# Patient Record
Sex: Female | Born: 1945 | Race: White | Hispanic: No | Marital: Married | Smoking: Never smoker
Health system: Southern US, Community
[De-identification: ages and names within clinical notes are randomized; demographics above are authoritative.]

## PROBLEM LIST (undated history)

## (undated) DIAGNOSIS — K219 Gastro-esophageal reflux disease without esophagitis: Secondary | ICD-10-CM

## (undated) DIAGNOSIS — M1990 Unspecified osteoarthritis, unspecified site: Secondary | ICD-10-CM

## (undated) DIAGNOSIS — S82899A Other fracture of unspecified lower leg, initial encounter for closed fracture: Secondary | ICD-10-CM

## (undated) DIAGNOSIS — I739 Peripheral vascular disease, unspecified: Secondary | ICD-10-CM

## (undated) HISTORY — PX: TONSILLECTOMY: SUR1361

---

## 2000-09-15 ENCOUNTER — Encounter: Admission: RE | Admit: 2000-09-15 | Discharge: 2000-09-15 | Payer: Self-pay | Admitting: Emergency Medicine

## 2006-04-07 ENCOUNTER — Ambulatory Visit (HOSPITAL_COMMUNITY): Admission: RE | Admit: 2006-04-07 | Discharge: 2006-04-07 | Payer: Self-pay | Admitting: Family Medicine

## 2008-02-09 ENCOUNTER — Emergency Department (HOSPITAL_COMMUNITY): Admission: EM | Admit: 2008-02-09 | Discharge: 2008-02-09 | Payer: Self-pay | Admitting: Emergency Medicine

## 2010-05-05 ENCOUNTER — Ambulatory Visit (HOSPITAL_COMMUNITY): Admission: RE | Admit: 2010-05-05 | Discharge: 2010-05-05 | Payer: Self-pay | Admitting: Family Medicine

## 2010-10-24 HISTORY — PX: OTHER SURGICAL HISTORY: SHX169

## 2011-10-29 DIAGNOSIS — J019 Acute sinusitis, unspecified: Secondary | ICD-10-CM | POA: Diagnosis not present

## 2011-10-29 DIAGNOSIS — Z6833 Body mass index (BMI) 33.0-33.9, adult: Secondary | ICD-10-CM | POA: Diagnosis not present

## 2011-10-29 DIAGNOSIS — J069 Acute upper respiratory infection, unspecified: Secondary | ICD-10-CM | POA: Diagnosis not present

## 2011-10-31 ENCOUNTER — Other Ambulatory Visit (HOSPITAL_COMMUNITY): Payer: Self-pay | Admitting: Family Medicine

## 2011-10-31 DIAGNOSIS — Z139 Encounter for screening, unspecified: Secondary | ICD-10-CM

## 2011-11-03 ENCOUNTER — Ambulatory Visit (HOSPITAL_COMMUNITY)
Admission: RE | Admit: 2011-11-03 | Discharge: 2011-11-03 | Disposition: A | Discharge status: Home / Self Care | Payer: Medicare Other | Source: Ambulatory Visit | Attending: Family Medicine | Admitting: Family Medicine

## 2011-11-03 DIAGNOSIS — Z13820 Encounter for screening for osteoporosis: Secondary | ICD-10-CM | POA: Insufficient documentation

## 2011-11-03 DIAGNOSIS — Z139 Encounter for screening, unspecified: Secondary | ICD-10-CM

## 2011-11-03 DIAGNOSIS — Z780 Asymptomatic menopausal state: Secondary | ICD-10-CM | POA: Diagnosis not present

## 2011-11-03 DIAGNOSIS — Z1231 Encounter for screening mammogram for malignant neoplasm of breast: Secondary | ICD-10-CM | POA: Insufficient documentation

## 2012-08-28 DIAGNOSIS — N951 Menopausal and female climacteric states: Secondary | ICD-10-CM | POA: Diagnosis not present

## 2012-08-28 DIAGNOSIS — Z30432 Encounter for removal of intrauterine contraceptive device: Secondary | ICD-10-CM | POA: Diagnosis not present

## 2012-08-28 DIAGNOSIS — Z1151 Encounter for screening for human papillomavirus (HPV): Secondary | ICD-10-CM | POA: Diagnosis not present

## 2012-08-28 DIAGNOSIS — Z124 Encounter for screening for malignant neoplasm of cervix: Secondary | ICD-10-CM | POA: Diagnosis not present

## 2012-08-28 DIAGNOSIS — Z01419 Encounter for gynecological examination (general) (routine) without abnormal findings: Secondary | ICD-10-CM | POA: Diagnosis not present

## 2012-09-10 DIAGNOSIS — Z30432 Encounter for removal of intrauterine contraceptive device: Secondary | ICD-10-CM | POA: Diagnosis not present

## 2012-09-10 DIAGNOSIS — N882 Stricture and stenosis of cervix uteri: Secondary | ICD-10-CM | POA: Diagnosis not present

## 2012-10-28 ENCOUNTER — Encounter (HOSPITAL_COMMUNITY): Payer: Self-pay | Admitting: *Deleted

## 2012-10-28 ENCOUNTER — Emergency Department (HOSPITAL_COMMUNITY)
Admission: EM | Admit: 2012-10-28 | Discharge: 2012-10-28 | Disposition: A | Discharge status: Home / Self Care | Payer: Medicare Other | Attending: Emergency Medicine | Admitting: Emergency Medicine

## 2012-10-28 ENCOUNTER — Emergency Department (HOSPITAL_COMMUNITY): Payer: Medicare Other

## 2012-10-28 DIAGNOSIS — R51 Headache: Secondary | ICD-10-CM | POA: Diagnosis not present

## 2012-10-28 DIAGNOSIS — M542 Cervicalgia: Secondary | ICD-10-CM | POA: Diagnosis not present

## 2012-10-28 DIAGNOSIS — T148XXA Other injury of unspecified body region, initial encounter: Secondary | ICD-10-CM

## 2012-10-28 DIAGNOSIS — S0990XA Unspecified injury of head, initial encounter: Secondary | ICD-10-CM | POA: Diagnosis not present

## 2012-10-28 DIAGNOSIS — S8390XA Sprain of unspecified site of unspecified knee, initial encounter: Secondary | ICD-10-CM

## 2012-10-28 DIAGNOSIS — S82899A Other fracture of unspecified lower leg, initial encounter for closed fracture: Secondary | ICD-10-CM | POA: Diagnosis not present

## 2012-10-28 DIAGNOSIS — IMO0002 Reserved for concepts with insufficient information to code with codable children: Secondary | ICD-10-CM | POA: Diagnosis not present

## 2012-10-28 DIAGNOSIS — Y9389 Activity, other specified: Secondary | ICD-10-CM | POA: Insufficient documentation

## 2012-10-28 DIAGNOSIS — M25469 Effusion, unspecified knee: Secondary | ICD-10-CM | POA: Diagnosis not present

## 2012-10-28 DIAGNOSIS — S8263XA Displaced fracture of lateral malleolus of unspecified fibula, initial encounter for closed fracture: Secondary | ICD-10-CM | POA: Diagnosis not present

## 2012-10-28 DIAGNOSIS — S51009A Unspecified open wound of unspecified elbow, initial encounter: Secondary | ICD-10-CM | POA: Diagnosis not present

## 2012-10-28 DIAGNOSIS — S5000XA Contusion of unspecified elbow, initial encounter: Secondary | ICD-10-CM | POA: Diagnosis not present

## 2012-10-28 DIAGNOSIS — S82409A Unspecified fracture of shaft of unspecified fibula, initial encounter for closed fracture: Secondary | ICD-10-CM

## 2012-10-28 DIAGNOSIS — Y929 Unspecified place or not applicable: Secondary | ICD-10-CM | POA: Insufficient documentation

## 2012-10-28 DIAGNOSIS — W1809XA Striking against other object with subsequent fall, initial encounter: Secondary | ICD-10-CM | POA: Insufficient documentation

## 2012-10-28 MED ORDER — HYDROCODONE-ACETAMINOPHEN 5-325 MG PO TABS
ORAL_TABLET | ORAL | Status: AC
  Filled 2012-10-28: qty 4

## 2012-10-28 MED ORDER — LIDOCAINE HCL (PF) 1 % IJ SOLN
5.0000 mL | Freq: Once | INTRAMUSCULAR | Status: AC
  Administered 2012-10-28: 5 mL
  Filled 2012-10-28: qty 5

## 2012-10-28 MED ORDER — HYDROCODONE-ACETAMINOPHEN 5-325 MG PO TABS: 1.0000 | ORAL_TABLET | ORAL | Status: DC | PRN

## 2012-10-28 NOTE — ED Provider Notes (Signed)
History     CSN: [id number]  Arrival date & time 10/28/12  1845   First MD Initiated Contact with Patient 10/28/12 1904      Chief Complaint  Patient presents with  . Fall    (Consider location/radiation/quality/duration/timing/severity/associated sxs/prior treatment) HPI Comments: Patient comes to the ER for evaluation after a fall. Patient reports a fall just prior to arrival. She was walking down a flight of steps and that she was at the bottom, missed last step. She fell injuring her left knee and ankle. She also hit the back of her head but there was no loss of consciousness. Additionally, patient reports that she has a laceration over the left elbow. Pain in the knee it is moderate to severe. She has a slight headache. Pain in the elbow is mild.  Patient is a 67 y.o. female presenting with fall.  Fall Associated symptoms include headaches.    History reviewed. No pertinent past medical history.  History reviewed. No pertinent past surgical history.  No family history on file.  History  Substance Use Topics  . Smoking status: Never Smoker   . Smokeless tobacco: Not on file  . Alcohol Use: No    OB History    Grav Para Term Preterm Abortions TAB SAB Ect Mult Living                  Review of Systems  HENT: Positive for neck pain.   Musculoskeletal: Negative for back pain.  Skin: Positive for wound.  Neurological: Positive for headaches.  All other systems reviewed and are negative.    Allergies  Codeine  Home Medications  No current outpatient prescriptions on file.  BP 196/87  Pulse 89  Temp 97.4 F (36.3 C) (Oral)  Resp 20  Ht 5\' 6"  (1.676 m)  Wt 210 lb (95.255 kg)  BMI 33.89 kg/m2  SpO2 100%  Physical Exam  Constitutional: She is oriented to person, place, and time. She appears well-developed and well-nourished. No distress.  HENT:  Head: Normocephalic and atraumatic.  Right Ear: Hearing normal.  Nose: Nose normal.  Mouth/Throat:  Oropharynx is clear and moist and mucous membranes are normal.  Eyes: Conjunctivae normal and EOM are normal. Pupils are equal, round, and reactive to light.  Neck: Normal range of motion. Neck supple.  Cardiovascular: Normal rate, regular rhythm, S1 normal and S2 normal.  Exam reveals no gallop and no friction rub.   No murmur heard. Pulmonary/Chest: Effort normal and breath sounds normal. No respiratory distress. She exhibits no tenderness.  Abdominal: Soft. Normal appearance and bowel sounds are normal. There is no hepatosplenomegaly. There is no tenderness. There is no rebound, no guarding, no tenderness at McBurney's point and negative Murphy's sign. No hernia.  Musculoskeletal: Normal range of motion.       Left knee: She exhibits swelling and effusion. She exhibits no deformity. tenderness found.       Arms: Neurological: She is alert and oriented to person, place, and time. She has normal strength. No cranial nerve deficit or sensory deficit. Coordination normal. GCS eye subscore is 4. GCS verbal subscore is 5. GCS motor subscore is 6.  Skin: Skin is warm and dry. Laceration noted. No rash noted. No cyanosis.  Psychiatric: She has a normal mood and affect. Her speech is normal and behavior is normal. Thought content normal.    ED Course  Procedures (including critical care time)  LACERATION REPAIR Performed by: Gilda Crease. Authorized by: Gilda Crease  Consent: Verbal consent obtained. Risks and benefits: risks, benefits and alternatives were discussed Consent given by: patient Patient identity confirmed: provided demographic data Prepped and Draped in normal sterile fashion Wound explored  Laceration Location: left elbow    Laceration Length: 2cm, jagged and contused  No Foreign Bodies seen or palpated  Anesthesia: local infiltration  Local anesthetic: lidocaine 1% without epinephrine  Anesthetic total: 4 ml  Irrigation method: syringe Amount of  cleaning: standard  Skin closure: suture  Number of sutures: 3  Technique: simple interrupted  Patient tolerance: Patient tolerated the procedure well with no immediate complications.  Labs Reviewed - No data to display Dg Ankle Complete Left  10/28/2012  *RADIOLOGY REPORT*  Clinical Data: Ankle pain post fall  LEFT ANKLE COMPLETE - 3+ VIEW  Comparison: None.  Findings: There is a spiral fracture of the distal fibula with soft tissue swelling.  Slight overriding fracture fragments.  Mild vascular calcification.  Ankle mortise intact.  Achilles and plantar spurs.  IMPRESSION: Spiral fracture distal fibula.  Soft tissue swelling.   Original Report Authenticated By: Davonna Belling, M.D.    Ct Head Wo Contrast  10/28/2012  *RADIOLOGY REPORT*  Clinical Data:  Larey Seat and hit back of head on bricks this morning. Headache.  CT HEAD WITHOUT CONTRAST  Technique:  Contiguous axial images were obtained from the base of the skull through the vertex without contrast  Comparison:  None.  Findings:  The brain has a normal appearance without evidence for hemorrhage, acute infarction, hydrocephalus, or mass lesion.  There is no extra axial fluid collection.  The skull and paranasal sinuses are normal.  IMPRESSION: Normal CT of the head without contrast.   Original Report Authenticated By: Davonna Belling, M.D.    Dg Knee Complete 4 Views Left  10/28/2012  *RADIOLOGY REPORT*  Clinical Data: Left knee and ankle pain.  Fall.  LEFT KNEE - COMPLETE 4+ VIEW  Comparison: None.  Findings: Moderate osteoarthritis noted with loss of articular space and marginal spurring.  Small to moderate knee effusion is suspected.  No definite fracture or acute bony findings.  IMPRESSION: 1.  Moderate osteoarthritis.  2.  Small to moderate knee effusion.   Original Report Authenticated By: Gaylyn Rong, M.D.      1. Contusion   2. Fibula fracture   3. Knee sprain       MDM  Patient comes to the ER for evaluation after a fall. Patient  is complaining of pain in the left ankle and knee. She also hit her head. CAT scan was performed and was negative. Patient denies any neck or back pain. There is no tenderness. Neck and back were clinically cleared. X-ray of the patient's left knee showed effusion. Examination did show palpable effusion and there is concern for possible ligamentous injury. Additionally x-ray of the ankle shows fibular fracture. This will require orthopedic evaluation and possible intervention. This would like to see Dr. Romeo Apple.  Patient has a wheelchair and crutches at home. She says she manages well on crutches. She will be nonweightbearing status. Patient placed in a sugar tong stirrup splint and is to call orthopedics in the morning.        Gilda Crease, MD 10/28/12 2152

## 2012-10-28 NOTE — ED Notes (Signed)
Fell today. PTA.  States hit head, but denies loc.  C/o pain to left knee, left elbow, and left ankle.

## 2012-10-28 NOTE — ED Notes (Signed)
Pt states that she has crutches and wheelchair at home

## 2012-10-28 NOTE — ED Notes (Signed)
Pt missed last step and fell, has a knot to posterior left of head, denies LOC; c/o left ankle and left knee pain and unable to put weight on left leg; lac to left elbow as well, last tetanus shot within last 5 years per pt

## 2012-10-30 ENCOUNTER — Ambulatory Visit (INDEPENDENT_AMBULATORY_CARE_PROVIDER_SITE_OTHER): Payer: Medicare Other | Admitting: Orthopedic Surgery

## 2012-10-30 ENCOUNTER — Encounter: Payer: Self-pay | Admitting: Orthopedic Surgery

## 2012-10-30 VITALS — BP 128/72 | Ht 66.0 in | Wt 210.0 lb

## 2012-10-30 DIAGNOSIS — S8263XA Displaced fracture of lateral malleolus of unspecified fibula, initial encounter for closed fracture: Secondary | ICD-10-CM | POA: Diagnosis not present

## 2012-10-30 NOTE — Patient Instructions (Addendum)
No weight beraing on left ankle     3 weeks cast change on Thursday PM

## 2012-10-31 ENCOUNTER — Encounter: Payer: Self-pay | Admitting: Orthopedic Surgery

## 2012-10-31 DIAGNOSIS — S8263XA Displaced fracture of lateral malleolus of unspecified fibula, initial encounter for closed fracture: Secondary | ICD-10-CM | POA: Insufficient documentation

## 2012-10-31 NOTE — Progress Notes (Signed)
  Subjective:    Patient ID: Theresa Pierce, female    DOB: [birth year], 67 y.o.   MRN: [id number]  Ankle Pain  Incident onset: January 5. The incident occurred at home. The injury mechanism was a fall. The pain is present in the left ankle. The quality of the pain is described as aching (Dull throbbing). The pain is at a severity of 5/10. The pain is moderate. The pain has been constant since onset. Associated symptoms include an inability to bear weight, a loss of motion, numbness and tingling. Pertinent negatives include no muscle weakness.      Review of Systems  Skin:       Easy bruising  Neurological: Positive for tingling and numbness.  All other systems reviewed and are negative.       Objective:   Physical Exam  Constitutional: She is oriented to person, place, and time. She appears well-developed and well-nourished.  HENT:  Head: Normocephalic.  Neck: Normal range of motion.  Cardiovascular: Intact distal pulses.   Musculoskeletal:       Right ankle: Normal.       Left ankle: She exhibits decreased range of motion, swelling and ecchymosis. She exhibits no deformity, no laceration and normal pulse. tenderness. Lateral malleolus, AITFL and CF ligament tenderness found. No medial malleolus, no posterior TFL, no head of 5th metatarsal and no proximal fibula tenderness found. Achilles tendon exhibits no pain, no defect and normal Thompson's test results.       Upper extremities Upper extremity exam  The right and left upper extremity:  Inspection and palpation revealed no abnormalities in the upper extremities.  Range of motion is full without contracture. Motor exam is normal with grade 5 strength. The joints are fully reduced without subluxation. There is no atrophy or tremor and muscle tone is normal.  All joints are stable.      Neurological: She is alert and oriented to person, place, and time. She has normal reflexes. She displays normal reflexes. She exhibits  normal muscle tone. Coordination normal.  Skin: Skin is warm and dry. No rash noted. No erythema. No pallor.  Psychiatric: She has a normal mood and affect. Her behavior is normal. Judgment and thought content normal.     Imaging shows that she has a labral illness fracture nondisplaced at the Weber B. type fracture if not a Weber C. type fracture. But the mortise is intact       Assessment & Plan:  The patient is placed in a short leg nonweightbearing cast. We could not get her foot to complete dorsiflexion will have to change the cast in 3 weeks patient made aware  Followup in 3 weeks for cast change. She will also need to have her sutures taken out in about a week. The nurse will take care of that.

## 2012-11-07 ENCOUNTER — Ambulatory Visit (INDEPENDENT_AMBULATORY_CARE_PROVIDER_SITE_OTHER): Payer: Medicare Other | Admitting: Orthopedic Surgery

## 2012-11-07 DIAGNOSIS — T148XXA Other injury of unspecified body region, initial encounter: Secondary | ICD-10-CM

## 2012-11-07 DIAGNOSIS — IMO0002 Reserved for concepts with insufficient information to code with codable children: Secondary | ICD-10-CM

## 2012-11-07 NOTE — Progress Notes (Signed)
Patient ID: Theresa Pierce, female   DOB: [birth year], 66 y.o.   MRN: [id number] Nurse reported wound clean   steristrips applied

## 2012-11-22 ENCOUNTER — Encounter: Payer: Self-pay | Admitting: Orthopedic Surgery

## 2012-11-22 ENCOUNTER — Ambulatory Visit (INDEPENDENT_AMBULATORY_CARE_PROVIDER_SITE_OTHER): Payer: Medicare Other | Admitting: Orthopedic Surgery

## 2012-11-22 VITALS — BP 138/76 | Ht 66.0 in | Wt 210.0 lb

## 2012-11-22 DIAGNOSIS — S8263XA Displaced fracture of lateral malleolus of unspecified fibula, initial encounter for closed fracture: Secondary | ICD-10-CM | POA: Diagnosis not present

## 2012-11-22 NOTE — Progress Notes (Signed)
Patient ID: Theresa Pierce, female   DOB: [birth year], 67 y.o.   MRN: [id number] BP 138/76  Ht 5\' 6"  (1.676 m)  Wt 210 lb (95.255 kg)  BMI 33.89 kg/m2  Chief Complaint  Patient presents with  . Follow-up    cast change and fractore care DOI 10/28/12    1. Lateral malleolar fracture    Cast change today to bring the foot into more neutral position.  X-ray 4 weeks in cast continued tiptoe weightbearing status

## 2012-11-22 NOTE — Patient Instructions (Addendum)
Keep cast dry 

## 2012-12-20 ENCOUNTER — Ambulatory Visit (INDEPENDENT_AMBULATORY_CARE_PROVIDER_SITE_OTHER): Payer: Medicare Other

## 2012-12-20 ENCOUNTER — Ambulatory Visit (INDEPENDENT_AMBULATORY_CARE_PROVIDER_SITE_OTHER): Payer: Medicare Other | Admitting: Orthopedic Surgery

## 2012-12-20 VITALS — BP 138/84 | Ht 66.0 in | Wt 210.0 lb

## 2012-12-20 DIAGNOSIS — S8262XD Displaced fracture of lateral malleolus of left fibula, subsequent encounter for closed fracture with routine healing: Secondary | ICD-10-CM

## 2012-12-20 DIAGNOSIS — S82892D Other fracture of left lower leg, subsequent encounter for closed fracture with routine healing: Secondary | ICD-10-CM

## 2012-12-20 DIAGNOSIS — S8290XD Unspecified fracture of unspecified lower leg, subsequent encounter for closed fracture with routine healing: Secondary | ICD-10-CM | POA: Diagnosis not present

## 2012-12-20 NOTE — Progress Notes (Signed)
Patient ID: Theresa Pierce, female   DOB: [birth year], 67 y.o.   MRN: [id number] Chief Complaint  Patient presents with  . Follow-up    4 week recheck on left ankle fracture with xray in cast./ JAN 5TH doi     BP 138/84  Ht 5\' 6"  (1.676 m)  Wt 210 lb (95.255 kg)  BMI 33.91 kg/m2  Ankle fracture, left, closed, with routine healing, subsequent encounter - Plan: DG Ankle Complete Left  Lateral malleolar fracture, left, closed, with routine healing, subsequent encounter   Routine followup x-ray in cast fracture looks like it's healing. Mortise is intact.  Recommend weightbearing as tolerated in a Cam Walker followup one month for an x-ray

## 2012-12-20 NOTE — Patient Instructions (Signed)
APPLY WEIGHT AS TOLERATED   WEAR BOOT TO WALK

## 2013-01-29 ENCOUNTER — Encounter: Payer: Self-pay | Admitting: Orthopedic Surgery

## 2013-01-29 ENCOUNTER — Ambulatory Visit (INDEPENDENT_AMBULATORY_CARE_PROVIDER_SITE_OTHER): Payer: Medicare Other | Admitting: Orthopedic Surgery

## 2013-01-29 ENCOUNTER — Ambulatory Visit (INDEPENDENT_AMBULATORY_CARE_PROVIDER_SITE_OTHER): Payer: Medicare Other

## 2013-01-29 VITALS — BP 130/90 | Ht 66.0 in | Wt 210.0 lb

## 2013-01-29 DIAGNOSIS — S8290XD Unspecified fracture of unspecified lower leg, subsequent encounter for closed fracture with routine healing: Secondary | ICD-10-CM | POA: Diagnosis not present

## 2013-01-29 DIAGNOSIS — S82892D Other fracture of left lower leg, subsequent encounter for closed fracture with routine healing: Secondary | ICD-10-CM

## 2013-01-29 DIAGNOSIS — S82899A Other fracture of unspecified lower leg, initial encounter for closed fracture: Secondary | ICD-10-CM | POA: Insufficient documentation

## 2013-01-29 NOTE — Patient Instructions (Addendum)
Air cast   2 months then x-rays   Stocking left foot

## 2013-01-29 NOTE — Progress Notes (Signed)
Patient ID: Theresa Pierce, female   DOB: 1946/08/12, 67 y.o.   MRN: [id number] Chief Complaint  Patient presents with  . Follow-up    5 week recheck with xray left ankle.    BP 130/90  Ht 5\' 6"  (1.676 m)  Wt 210 lb (95.255 kg)  BMI 33.91 kg/m2  Ankle fracture, left, closed, with routine healing, subsequent encounter - Plan: DG Ankle Complete Left   Repeat x-ray this is week #12 status post left ankle fracture treated with Cam Walker  X-ray show the fracture is healing alignment is good on the mortise view  She still tender over the fracture site she has some foot and ankle edema  Recommend Aircast weight-bear as tolerated compression stocking history of venous stripping bilaterally  Complains of numbness resolving left foot and ankle  X-ray next visit

## 2013-02-01 ENCOUNTER — Telehealth: Payer: Self-pay | Admitting: Orthopedic Surgery

## 2013-02-01 NOTE — Telephone Encounter (Signed)
USE otc

## 2013-02-01 NOTE — Telephone Encounter (Signed)
[facility] called about the prescription you gave [name] for 20-30 knee high hose.  April said Theresa Pierce does not want to wear anything that is tight.   She can get size 8-15 over the counter, unless you want to write for a lower number than 20-30. April can be reached at [phone].

## 2013-02-01 NOTE — Telephone Encounter (Signed)
Called back to [place]; relayed to April.  Patient is getting the over the counter.

## 2013-02-05 DIAGNOSIS — Z681 Body mass index (BMI) 19 or less, adult: Secondary | ICD-10-CM | POA: Diagnosis not present

## 2013-02-05 DIAGNOSIS — Z7182 Exercise counseling: Secondary | ICD-10-CM | POA: Diagnosis not present

## 2013-02-05 DIAGNOSIS — Z713 Dietary counseling and surveillance: Secondary | ICD-10-CM | POA: Diagnosis not present

## 2013-02-05 DIAGNOSIS — R609 Edema, unspecified: Secondary | ICD-10-CM | POA: Diagnosis not present

## 2013-04-02 ENCOUNTER — Ambulatory Visit (INDEPENDENT_AMBULATORY_CARE_PROVIDER_SITE_OTHER): Payer: Medicare Other

## 2013-04-02 ENCOUNTER — Ambulatory Visit: Payer: Medicare Other

## 2013-04-02 ENCOUNTER — Ambulatory Visit (INDEPENDENT_AMBULATORY_CARE_PROVIDER_SITE_OTHER): Payer: Medicare Other | Admitting: Orthopedic Surgery

## 2013-04-02 VITALS — BP 158/90 | Ht 66.0 in | Wt 210.0 lb

## 2013-04-02 DIAGNOSIS — IMO0001 Reserved for inherently not codable concepts without codable children: Secondary | ICD-10-CM | POA: Diagnosis not present

## 2013-04-02 DIAGNOSIS — S82892D Other fracture of left lower leg, subsequent encounter for closed fracture with routine healing: Secondary | ICD-10-CM

## 2013-04-02 DIAGNOSIS — S82891D Other fracture of right lower leg, subsequent encounter for closed fracture with routine healing: Secondary | ICD-10-CM

## 2013-04-02 NOTE — Patient Instructions (Addendum)
WEAR BRACE TO WORK UNTIL June 30TH MAKE APPOINTMENT FOR LEFT KNEE EVAL

## 2013-04-02 NOTE — Progress Notes (Signed)
Patient ID: Theresa Pierce, female   DOB: 1946-03-31, 67 y.o.   MRN: [id number] Chief Complaint  Patient presents with  . Follow-up    2 month recheck left ankle fracture; DOI 10-28-12    Fracture care for left ankle lateral malleolus fracture followup x-ray  X-ray shows fracture bridging  Recommend edema stocking, Aircast until the end of the month  The patient will schedule appointment for knee x-ray and evaluation for long-term long-standing knee pain

## 2013-04-23 ENCOUNTER — Ambulatory Visit (INDEPENDENT_AMBULATORY_CARE_PROVIDER_SITE_OTHER): Payer: Medicare Other

## 2013-04-23 ENCOUNTER — Ambulatory Visit (INDEPENDENT_AMBULATORY_CARE_PROVIDER_SITE_OTHER): Payer: Medicare Other | Admitting: Orthopedic Surgery

## 2013-04-23 VITALS — BP 154/90 | Ht 66.0 in | Wt 210.0 lb

## 2013-04-23 DIAGNOSIS — M23329 Other meniscus derangements, posterior horn of medial meniscus, unspecified knee: Secondary | ICD-10-CM | POA: Diagnosis not present

## 2013-04-23 DIAGNOSIS — M25562 Pain in left knee: Secondary | ICD-10-CM

## 2013-04-23 DIAGNOSIS — M1710 Unilateral primary osteoarthritis, unspecified knee: Secondary | ICD-10-CM

## 2013-04-23 DIAGNOSIS — M25569 Pain in unspecified knee: Secondary | ICD-10-CM | POA: Diagnosis not present

## 2013-04-23 DIAGNOSIS — M23322 Other meniscus derangements, posterior horn of medial meniscus, left knee: Secondary | ICD-10-CM

## 2013-04-23 NOTE — Progress Notes (Signed)
Patient ID: Theresa Pierce, female   DOB: 11-03-1945, 67 y.o.   MRN: [id number] Chief Complaint  Patient presents with  . Knee Pain    Left knee pain d/t injury 10/28/12    History 67 year-old female status post left ankle injury presents with a history of chronic pain and giving out symptoms in her left knee worsened since her injury of her ankle in January. She takes Aleve, Tylenol, Advil as needed. Complains of swelling 1/10 pain with catching grinding and swelling and giving out symptoms of the left knee  Review of systems 14 systems are negative except for the musculoskeletal and hematologic system easy bruising  She did have a vein closing procedure bilaterally for chronic venous stasis disease  She is allergic to codeine otherwise healthy  Family history heart disease arthritis and cancer  Married no smoking or drinking history. She is followed by [facility].  Vital signs BP 154/90  Ht 5\' 6"  (1.676 m)  Wt 210 lb (95.255 kg)  BMI 33.91 kg/m2   General appearance: the patient is well-developed and well-nourished, grooming and hygiene are normal, body habitus into more from body habitus  The patient is alert and oriented x 3; mood and affect are normal  Ambulatory status no assistive devices  /Left knee Inspection medial jointline tenderness crepitance in the patellofemoral joint Range of motion 125 The Lachman test is normal the anterior and posterior drawer tests are normal and the collateral ligaments are stable Motor exam 5/5 Skin normal; no rash or laceration  McMurray's sign negative  The right  knee Inspection revealed no tenderness, ROM was normal and motor exam grade 5/5 quad strength. 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 BRace. Economy hinged a wrap Physical therapy. Return 6 weeks see if improvement has not discussed surgical options  Knee  Injection Procedure Note  Pre-operative Diagnosis: left knee oa  Post-operative Diagnosis: same  Indications: pain  Anesthesia: ethyl chloride   Procedure Details   Verbal consent was obtained for the procedure. Time out was completed.The joint was prepped with alcohol, followed by  Ethyl chloride spray and A 20 gauge needle was inserted into the knee via lateral approach; 4ml 1% lidocaine and 1 ml of depomedrol  was then injected into the joint . The needle was removed and the area cleansed and dressed.  Complications:  None; patient tolerated the procedure well.

## 2013-04-23 NOTE — Patient Instructions (Addendum)
You have received a steroid shot. 15% of patients experience increased pain at the injection site with in the next 24 hours. This is best treated with ice and tylenol extra strength 2 tabs every 8 hours. If you are still having pain please call the office.   Wear brace daily when walking  Take aleve or tylenol   Start therapy

## 2013-05-02 ENCOUNTER — Ambulatory Visit (HOSPITAL_COMMUNITY)
Admission: RE | Admit: 2013-05-02 | Discharge: 2013-05-02 | Disposition: A | Discharge status: Home / Self Care | Payer: Medicare Other | Source: Ambulatory Visit | Attending: Orthopedic Surgery | Admitting: Orthopedic Surgery

## 2013-05-02 DIAGNOSIS — M25569 Pain in unspecified knee: Secondary | ICD-10-CM | POA: Diagnosis not present

## 2013-05-02 DIAGNOSIS — IMO0001 Reserved for inherently not codable concepts without codable children: Secondary | ICD-10-CM | POA: Diagnosis not present

## 2013-05-02 NOTE — Evaluation (Signed)
Physical Therapy Evaluation  Patient Details  Name: Theresa Pierce MRN: [id number] Date of Birth: 1946-02-10  Today's Date: 05/02/2013 Time: 0805-0850 PT Time Calculation (min): 45 min Charges: 1 evaluation             Visit#: 1 of 6  Re-eval: 05/23/13 Assessment Diagnosis: Knee pain Next MD Visit: Dr. 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  her c/co: pain when it gives away on her (2x/since January), feeling like she will feel , she is very cautious when she walks, she has increased fear of falling, difficulty ascending and descending steps reciprocally due to hip pain and knee instability.  She feels that she is limping more with her knee brace, she is not sure if it is because of her knee or ankle.  She has decreased feeling to the bottom of her foot.  How long can you walk comfortably?: has to watch where she works.  Pain Assessment Currently in Pain?: Yes Pain Score: 0-No pain Pain Location: Knee Pain Orientation: Left Pain Onset: More than a month ago Pain Frequency: Intermittent Pain Relieving Factors: advil/alleve or tyleonol when needed for her ankle. Effect of Pain on Daily Activities: fear of falling  Balance Screening Balance Screen Has the patient fallen in the past 6 months: Yes How many times?: 1 (multiple stumbling) Has the patient had a decrease in activity level because of a fear of falling? : Yes Is the patient reluctant to leave their home because of a fear of falling? 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    Cognition/Observation Observation/Other Assessments Observations: comes in with knee brace  Sensation/Coordination/Flexibility/Functional Tests Functional Tests Functional Tests: Lower Extremity Functional Scale: 44/80  LLE AROM (degrees) Left Knee Extension: 3 Left Knee Flexion: 138 LLE Strength Left Hip Flexion:  (4+/5) Left Hip Extension: 5/5 Left Hip ABduction: 5/5 Left Hip ADduction: 5/5 Left Knee Flexion: 3+/5 Left Knee Extension: 3+/5 Left Ankle Dorsiflexion: 5/5  Mobility/Balance  Ambulation/Gait Ambulation/Gait: Yes Gait Pattern: Antalgic;Left flexed knee in stance Static Standing Balance Single Leg Stance - Right Leg: 8 Single Leg Stance - Left Leg: 4 Tandem Stance - Right Leg: 10 Tandem Stance - Left Leg: 10 Rhomberg - Eyes Opened: 10 Rhomberg - Eyes Closed: 10   Exercise/Treatments Supine Quad Sets: Left;5 reps Short Arc Quad Sets: Left;5 reps Straight Leg Raises: Left;5 reps Patellar Mobs: demonstration and education Prone  Hamstring Curl: 5 reps Hip Extension: Left;5 reps    Physical Therapy Assessment and Plan PT Assessment and Plan Clinical Impression Statement: Theresa Pierce is referred to PT for knee pain which has become progressivly worse since her malleloar fracture she sustained when she fell down her outdoor step with impairments listed below.  At this time she is most limited by her  knee flexion in standing.  Encouraged pt d/c use of her knee pillow when sleeping. Pt will benefit from skilled therapeutic intervention in order to improve on the following deficits: Abnormal gait;Decreased balance;Decreased range of motion;Decreased strength Rehab Potential: Good PT Frequency:  Min 2X/week PT Duration:  (3 weeks) PT Treatment/Interventions: Therapeutic activities;Therapeutic exercise;Balance training;Patient/family education;Manual techniques PT Plan: Demonstrate HEP to improve knee strength.  Add squats, heel/toe raises, SLS and mat activities. to encourage LE strengthening and flexibility.     Goals Home Exercise Program Pt/caregiver will Perform Home Exercise Program: Independently PT Goal: Perform Home Exercise Program - Progress: Goal set today PT Short Term Goals Time to Complete Short Term Goals: 3 weeks PT Short Term Goal 1: Pt will improve her knee AROM 0-135 degrees for improved gait mechanics. PT Short Term Goal 2: Pt will report pain less than 4/10 when going from sit to stand.  PT Short Term Goal 3: Pt will improve her knee strength to WNL in order to ascend and descend her basement stairs with reciprocal pattern with 1 handrail.   Problem List Patient Active Problem List   Diagnosis Date Noted  . Ankle fracture 01/29/2013  . Lateral malleolar fracture 10/31/2012    PT Plan of Care PT Home Exercise Plan: see scanned report PT Patient Instructions: d/c use of pillow under knee, answered questions about diagnosis.  Consulted and Agree with Plan of Care: Patient  GP Functional Assessment Tool Used: LEFS Functional Limitation: Mobility: Walking and moving around Mobility: Walking and Moving Around Current Status [phone]): At least 40 percent but less than 60 percent impaired, limited or restricted Mobility: Walking and Moving Around Goal Status [phone]): At least 1 percent but less than 20 percent impaired, limited or restricted  Theresa Pierce, MPT, ATC 05/02/2013, 12:14 PM  Physician Documentation Your signature is required to indicate approval of the treatment plan as stated above.  Please sign and either send electronically or make a copy of this report for your files and return this physician signed original.   Please mark one 1.__approve of  plan  2. ___approve of plan with the following conditions.   ______________________________                                                          _____________________ Physician Signature                                                                                                             Date

## 2013-05-06 ENCOUNTER — Ambulatory Visit (HOSPITAL_COMMUNITY): Payer: Medicare Other | Admitting: Physical Therapy

## 2013-05-07 ENCOUNTER — Ambulatory Visit (HOSPITAL_COMMUNITY)
Admission: RE | Admit: 2013-05-07 | Discharge: 2013-05-07 | Disposition: A | Discharge status: Home / Self Care | Payer: Medicare Other | Source: Ambulatory Visit | Attending: Orthopedic Surgery | Admitting: Orthopedic Surgery

## 2013-05-07 NOTE — Progress Notes (Signed)
Physical Therapy Treatment Patient Details  Name: Theresa Pierce MRN: [id number] Date of Birth: [birth year]  Today's Date: 05/07/2013 Time: 7829-5621 PT Time Calculation (min): 40 min Charges:  TE: [id number] Visit#: 2 of 6  Re-eval: 05/23/13 Assessment Diagnosis: Knee pain Next MD Visit: Dr. Romeo Apple - 06/11/13  Authorization: Medicare  Authorization Time Period:    Authorization Visit#: 2 of 10   Subjective: Symptoms/Limitations Symptoms: Pt reports that she tried to do the exercises this saturday, but has not had time since. States she has taken her pillow out from under her knee.  Pain Assessment Currently in Pain?: Yes Pain Score: 4  Pain Location: Ankle Pain Orientation: Left  Precautions/Restrictions     Exercise/Treatments Stretches Active Hamstring Stretch: 2 reps;30 seconds Gastroc Stretch: 3 reps;30 seconds;Limitations Gastroc Stretch Limitations: slant board Aerobic Elliptical: NuStep: 8 minutes Standing Heel Raises: 10 reps;Limitations Functional Squat: 10 reps;Limitations SLS: 3x15 sec holds w/HHA Supine Quad Sets: 10 reps;Left Short Arc Quad Sets: 15 reps;Limitations;Left Short Arc Quad Sets Limitations: small towel roll 2# Bridges: 15 reps Straight Leg Raises: Left;15 reps Sidelying Hip ABduction: Both;15 reps Prone  Hamstring Curl: 15 reps;Limitations (Left) Hamstring Curl Limitations: 2# Hip Extension: Both;15 reps    Physical Therapy Assessment and Plan PT Assessment and Plan Clinical Impression Statement: Pt demonstrated HEP today with mod cueing for proper technique.  Able to achieve 0 degrees of extension. Teach back for when she is able to complete her HEP.  PT Treatment/Interventions: Therapeutic activities;Therapeutic exercise;Balance training;Patient/family education;Manual techniques PT Plan: Check to make sure pt is adherent with HEP.  Continue to progress LE strengtheing: standing knee flexion, 2 in stair training, bridges.  Progress  to wall squats when able.     Goals Home Exercise Program Pt/caregiver will Perform Home Exercise Program: Independently PT Goal: Perform Home Exercise Program - Progress: Progressing toward goal PT Short Term Goals Time to Complete Short Term Goals: 3 weeks PT Short Term Goal 1: Pt will improve her knee AROM 0-135 degrees for improved gait mechanics. PT Short Term Goal 1 - Progress: Met PT Short Term Goal 2: Pt will report pain less than 4/10 when going from sit to stand.  PT Short Term Goal 2 - Progress: Progressing toward goal PT Short Term Goal 3: Pt will improve her knee strength to WNL in order to ascend and descend her basement stairs with reciprocal pattern with 1 handrail.  PT Short Term Goal 3 - Progress: Progressing toward goal  Problem List Patient Active Problem List   Diagnosis Date Noted  . Ankle fracture 01/29/2013  . Lateral malleolar fracture 10/31/2012    PT Plan of Care PT Patient Instructions: importance to continue with HEP.  Consulted and Agree with Plan of Care: Patient  GP Functional Assessment Tool Used: LEFS  Kaisei Gilbo 05/07/2013, 8:40 AM

## 2013-05-09 ENCOUNTER — Ambulatory Visit (HOSPITAL_COMMUNITY)
Admission: RE | Admit: 2013-05-09 | Discharge: 2013-05-09 | Disposition: A | Discharge status: Home / Self Care | Payer: Medicare Other | Source: Ambulatory Visit | Attending: Orthopedic Surgery | Admitting: Orthopedic Surgery

## 2013-05-09 NOTE — Progress Notes (Signed)
Physical Therapy Treatment Patient Details  Name: Theresa Pierce MRN: [id number] Date of Birth: [birth year]  Today's Date: 05/09/2013 Time: 4098-1191 Charges: Therex x 40' (0800-0840) Total time: 48 minutes  Visit#: 3 of 6  Re-eval: 05/23/13  Authorization: Medicare  Authorization Visit#: 3 of 10   Subjective: Symptoms/Limitations Symptoms: Pt states that she is doing her exercises when she is able to.  She reports that she is more stiff this morning.  Feels she is progressing well, except this morning when it popped.  Pain Assessment Currently in Pain?: No/denies   Exercise/Treatments Stretches Gastroc Stretch: 3 reps;30 seconds;Limitations Gastroc Stretch Limitations: slant board Aerobic NuStep: 8 minutes L2 at end of session Standing Heel Raises: 15 reps;Limitations Heel Raises Limitations: Toe raises x 10 Lateral Step Up: 10 reps;Step Height: 4";Left Forward Step Up: 10 reps;Step Height: 6";Left Functional Squat: 15 reps Rocker Board: 2 minutes SLS: LLE 10" max of 5 Supine Short Arc Quad Sets: 15 reps;Left Short Arc Quad Sets Limitations: medium bolster 3# Bridges: 15 reps;Limitations Bridges Limitations: w/hip adduction squeeze Straight Leg Raises: Left;15 reps Sidelying Hip ABduction: Left;15 reps Hip ADduction: Right;10 reps Prone  Hamstring Curl: 15 reps Hamstring Curl Limitations: 3# Hip Extension: Both;15 reps  Physical Therapy Assessment and Plan PT Assessment and Plan Clinical Impression Statement: Pt able to demonstrate HEP with min cueing and facilitation only with hip abduction. Added squats and stairs to improve LE strength and function. Continued to encourage HEP. Pt is able to complete new exercises with minimal difficulty after initial cueing and demo. Pt is without complaint of increased pain throughout session. PT Treatment/Interventions: Therapeutic activities;Therapeutic exercise;Balance training;Patient/family education;Manual techniques PT  Plan: Check to make sure pt is adherent with HEP.  Continue to progress LE strengtheing: standing knee flexion, 2 in stair training, bridges.  Progress to wall squats when able.     Goals Home Exercise Program Pt/caregiver will Perform Home Exercise Program: Independently PT Goal: Perform Home Exercise Program - Progress: Partly met PT Short Term Goals Time to Complete Short Term Goals: 3 weeks PT Short Term Goal 1: Pt will improve her knee AROM 0-135 degrees for improved gait mechanics. PT Short Term Goal 1 - Progress: Met PT Short Term Goal 2: Pt will report pain less than 4/10 when going from sit to stand.  PT Short Term Goal 2 - Progress: Progressing toward goal PT Short Term Goal 3: Pt will improve her knee strength to WNL in order to ascend and descend her basement stairs with reciprocal pattern with 1 handrail.  PT Short Term Goal 3 - Progress: Progressing toward goal  Problem List Patient Active Problem List   Diagnosis Date Noted  . Ankle fracture 01/29/2013  . Lateral malleolar fracture 10/31/2012    PT Plan of Care PT Patient Instructions: importance to continue with HEP.  Consulted and Agree with Plan of Care: Patient  Seth Bake, PTA  05/09/2013, 8:49 AM

## 2013-05-14 ENCOUNTER — Ambulatory Visit (HOSPITAL_COMMUNITY)
Admission: RE | Admit: 2013-05-14 | Discharge: 2013-05-14 | Disposition: A | Discharge status: Home / Self Care | Payer: Medicare Other | Source: Ambulatory Visit | Attending: Family Medicine | Admitting: Family Medicine

## 2013-05-14 NOTE — Progress Notes (Signed)
Physical Therapy Treatment Patient Details  Name: Theresa Pierce MRN: [id number] Date of Birth: 21-Nov-1945  Today's Date: 05/14/2013 Time: 0802-0848 PT Time Calculation (min): 46 min Charge: TE 46' 0802-0848  Visit#: 4 of 6  Re-eval: 05/23/13 Assessment Diagnosis: Knee pain Next MD Visit: Dr. Romeo Apple - 06/11/13  Authorization: Medicare  Authorization Time Period:    Authorization Visit#: 4 of 10   Subjective: Symptoms/Limitations Symptoms: Pt stated she has minimal pain in knee, more in ankle .  Pt stated decreased comfidence with her balance.   Pain Assessment Currently in Pain?: Yes Pain Score: 1  Pain Location: Ankle Pain Orientation: Left  Objective:   Exercise/Treatments Aerobic Elliptical: NuStep: 10 minutes hill #3  Standing Heel Raises: Limitations Heel Raises Limitations: Toe raises x 10; toe walking 1RT Lateral Step Up: Left;10 reps;Hand Hold: 1;Step Height: 6" Forward Step Up: Left;15 reps;Hand Hold: 1;Step Height: 6" Step Down: Left;10 reps;Hand Hold: 1;Step Height: 6" Rocker Board: 2 minutes SLS: Lt 13", R 28" max of 3 Other Standing Knee Exercises: tandem, retro and side stepping 1RT Supine  time Sidelying  time Prone    time      Physical Therapy Assessment and Plan PT Assessment and Plan Clinical Impression Statement: Progressed therex, increased heigth with stair training for began step down for eccentric quadricep strengthening with noted musculature fatigue.  Added balance activties to improve pt.'s confidence with gait mechanics, verbal cueing to improve proprioception with foot placement.  Pt encouraged to increase frequency with HEP for maximum benefits and to apply ice to ankle and knee for pain control.   PT Plan: Continue with current POC for LE strengthening.  Continue strengthenig standing knee flexion, stair training, bridges.  Progress to wall squats when able.     Goals    Problem List Patient Active Problem List   Diagnosis  Date Noted  . Ankle fracture 01/29/2013  . Lateral malleolar fracture 10/31/2012    PT - End of Session Activity Tolerance: Patient tolerated treatment well General Behavior During Therapy: [facility] for tasks assessed/performed  GP    Juel Burrow 05/14/2013, 11:45 AM

## 2013-05-16 ENCOUNTER — Ambulatory Visit (HOSPITAL_COMMUNITY)
Admission: RE | Admit: 2013-05-16 | Discharge: 2013-05-16 | Disposition: A | Discharge status: Home / Self Care | Payer: Medicare Other | Source: Ambulatory Visit | Attending: Orthopedic Surgery | Admitting: Orthopedic Surgery

## 2013-05-16 NOTE — Progress Notes (Signed)
Physical Therapy Treatment Patient Details  Name: Theresa Pierce MRN: [id number] Date of Birth: [birth year]  Today's Date: 05/16/2013 Time: 0802-0846 PT Time Calculation (min): 44 min  Visit#: 5 of 6  Re-eval: 05/23/13 Authorization: Medicare  Authorization Visit#: 5 of 10  Charges: therex 42'  Subjective: Symptoms/Limitations Symptoms: Continued aching in her Lt ankle, overall Lt knee feels good. Pain Assessment Currently in Pain?: Yes Pain Score: 1  Pain Location: Ankle Pain Orientation: Left   Exercise/Treatments Aerobic Elliptical: NuStep: 10 minutes hill #3  Standing Heel Raises: 20 reps;Limitations Heel Raises Limitations: Toe raises x 20 Lateral Step Up: Left;15 reps;Hand Hold: 1;Step Height: 6" Wall Squat: 10 reps;5 seconds Rocker Board: 2 minutes SLS: Lt 33", R 35" max of 3 Other Standing Knee Exercises: tandem, retro, heel walk, toe walk 1RT Supine Bridges: 15 reps;Limitations Bridges Limitations: w/hip adduction squeeze       Physical Therapy Assessment and Plan PT Assessment and Plan Clinical Impression Statement: Continues to do well with therex, exhibiting good form.  Added wallslides.  Encouraged pt to perform her HEP daily.   PT Plan: Continue with current POC for LE strengthening.  Progress to reciprocal stairs.  Re-eval next visit (6 visits per Dr. Romeo Apple).      Problem List Patient Active Problem List   Diagnosis Date Noted  . Ankle fracture 01/29/2013  . Lateral malleolar fracture 10/31/2012    PT - End of Session Activity Tolerance: Patient tolerated treatment well General Behavior During Therapy: [facility] for tasks assessed/performed   Lurena Nida, PTA/CLT 05/16/2013, 8:49 AM

## 2013-05-21 ENCOUNTER — Ambulatory Visit (HOSPITAL_COMMUNITY)
Admission: RE | Admit: 2013-05-21 | Discharge: 2013-05-21 | Disposition: A | Discharge status: Home / Self Care | Payer: Medicare Other | Source: Ambulatory Visit | Attending: Family Medicine | Admitting: Family Medicine

## 2013-05-21 NOTE — Evaluation (Addendum)
Physical Therapy Discharge  Patient Details  Name: Theresa Pierce MRN: [id number] Date of Birth: 09-24-1946  Today's Date: 05/21/2013 Time: 0800-0845 PT Time Calculation (min): 45 min Charges: TE: 800-830 Self Care: [id number]             Visit#: 6 of 6  Re-eval: 05/23/13    Authorization: Medicare    Authorization Time Period:    Authorization Visit#: 6 of 10   Subjective Symptoms/Limitations Symptoms: Pt stated pain free this morning, feeling good. Continues to have fear of descending the steps and fearful of giving way.  How long can you sit comfortably?: unlimited How long can you walk comfortably?: 3 hours comfortably Pain Assessment Currently in Pain?: No/denies  Exercise/Treatments Aerobic Elliptical: NuStep: 10 minutes hill #3  Standing Forward Step Up: Left;15 reps;Hand Hold: 1;Step Height: 6" Step Down: Left;10 reps;Hand Hold: 1;Step Height: 6" Stairs: 2 round trips Ankle Exercises - Seated Towel Crunch: 1 rep Ankle Exercises - Supine T-Band: 4 way green t-band x20 reps Other Supine Ankle Exercises: Toe Yoga: Great toe flexion and extension, small toes opp direction, Toe abduction x20 each  Physical Therapy Assessment and Plan PT Assessment and Plan Clinical Impression Statement: Ms. Belfield has attended 6 OP PT visits over 3 weeks with the following findings: at this time pt is moderatly adherent with HEP for her knee, is most limited due to fear of knee and ankle giving way, educated and encouraged pt to continue with her HEP to improve her LE strength.  Provided exercises to help with her ankle strength today and pt reports she will be able to complete at home.  At this time will d/c from PT.  Would recommend if pt returns to MD to refer for ankle strengthening to improve functional motion.  PT Plan: D/C    Goals Home Exercise Program Pt/caregiver will Perform Home Exercise Program: Independently PT Goal: Perform Home Exercise Program - Progress: Progressing  toward goal PT Short Term Goals Time to Complete Short Term Goals: 3 weeks PT Short Term Goal 1: Pt will improve her knee AROM 0-135 degrees for improved gait mechanics. PT Short Term Goal 1 - Progress: Met PT Short Term Goal 2: Pt will report pain less than 4/10 when going from sit to stand.  PT Short Term Goal 2 - Progress: Met PT Short Term Goal 3: Pt will improve her knee strength to WNL in order to ascend and descend her basement stairs with reciprocal pattern with 1 handrail.  PT Short Term Goal 3 - Progress: Progressing toward goal  Problem List Patient Active Problem List   Diagnosis Date Noted  . Ankle fracture 01/29/2013  . Lateral malleolar fracture 10/31/2012    PT - End of Session Activity Tolerance: Patient tolerated treatment well General Behavior During Therapy: WFL for tasks assessed/performed PT Plan of Care PT Home Exercise Plan: updated with ankle PT Patient Instructions: importance of HEP, continue with HEP, answered questions about diagnosis.  Consulted and Agree with Plan of Care: Patient  GP Functional Assessment Tool Used: Clinical observation Functional Limitation: Mobility: Walking and moving around Mobility: Walking and Moving Around Goal Status [phone]): At least 1 percent but less than 20 percent impaired, limited or restricted Mobility: Walking and Moving Around Discharge Status [phone]): At least 1 percent but less than 20 percent impaired, limited or restricted  Kianna Billet, MPT, ATC 05/21/2013, 8:54 AM  Physician Documentation Your signature is required to indicate approval of the treatment plan as stated above.  Please sign  and either send electronically or make a copy of this report for your files and return this physician signed original.   Please mark one 1.__approve of plan  2. ___approve of plan with the following conditions.   ______________________________                                                           _____________________ Physician Signature                                                                                                             Date

## 2013-05-23 ENCOUNTER — Inpatient Hospital Stay (HOSPITAL_COMMUNITY): Admission: RE | Admit: 2013-05-23 | Payer: Medicare Other | Source: Ambulatory Visit

## 2013-05-28 ENCOUNTER — Ambulatory Visit (HOSPITAL_COMMUNITY): Payer: Medicare Other | Admitting: Physical Therapy

## 2013-05-30 ENCOUNTER — Ambulatory Visit (HOSPITAL_COMMUNITY): Payer: Medicare Other | Admitting: Physical Therapy

## 2013-06-04 ENCOUNTER — Ambulatory Visit: Payer: Medicare Other | Admitting: Orthopedic Surgery

## 2013-06-10 ENCOUNTER — Telehealth: Payer: Self-pay | Admitting: Orthopedic Surgery

## 2013-06-10 NOTE — Telephone Encounter (Signed)
Patient called, left voice message, states needs to cancel and hold on her follow up appointment from therapy again (2nd time of postponing); elects to call back to re-schedule.  Called back to offer re-schedule, left voice message.

## 2013-06-11 ENCOUNTER — Ambulatory Visit: Payer: Medicare Other | Admitting: Orthopedic Surgery

## 2013-08-09 DIAGNOSIS — J310 Chronic rhinitis: Secondary | ICD-10-CM | POA: Diagnosis not present

## 2013-08-09 DIAGNOSIS — J019 Acute sinusitis, unspecified: Secondary | ICD-10-CM | POA: Diagnosis not present

## 2013-08-09 DIAGNOSIS — Z6835 Body mass index (BMI) 35.0-35.9, adult: Secondary | ICD-10-CM | POA: Diagnosis not present

## 2014-02-11 ENCOUNTER — Ambulatory Visit: Payer: Medicare Other | Admitting: Orthopedic Surgery

## 2014-12-23 DIAGNOSIS — R5383 Other fatigue: Secondary | ICD-10-CM | POA: Diagnosis not present

## 2014-12-23 DIAGNOSIS — Z6835 Body mass index (BMI) 35.0-35.9, adult: Secondary | ICD-10-CM | POA: Diagnosis not present

## 2014-12-23 DIAGNOSIS — E6609 Other obesity due to excess calories: Secondary | ICD-10-CM | POA: Diagnosis not present

## 2014-12-23 DIAGNOSIS — Z0000 Encounter for general adult medical examination without abnormal findings: Secondary | ICD-10-CM | POA: Diagnosis not present

## 2015-02-02 DIAGNOSIS — S60450A Superficial foreign body of right index finger, initial encounter: Secondary | ICD-10-CM | POA: Diagnosis not present

## 2015-02-02 DIAGNOSIS — Z6835 Body mass index (BMI) 35.0-35.9, adult: Secondary | ICD-10-CM | POA: Diagnosis not present

## 2015-02-02 DIAGNOSIS — E6609 Other obesity due to excess calories: Secondary | ICD-10-CM | POA: Diagnosis not present

## 2015-11-17 DIAGNOSIS — D2239 Melanocytic nevi of other parts of face: Secondary | ICD-10-CM | POA: Diagnosis not present

## 2015-11-17 DIAGNOSIS — D235 Other benign neoplasm of skin of trunk: Secondary | ICD-10-CM | POA: Diagnosis not present

## 2015-11-17 DIAGNOSIS — L821 Other seborrheic keratosis: Secondary | ICD-10-CM | POA: Diagnosis not present

## 2015-12-28 DIAGNOSIS — Z23 Encounter for immunization: Secondary | ICD-10-CM | POA: Diagnosis not present

## 2016-01-08 DIAGNOSIS — E6609 Other obesity due to excess calories: Secondary | ICD-10-CM | POA: Diagnosis not present

## 2016-01-08 DIAGNOSIS — Z0000 Encounter for general adult medical examination without abnormal findings: Secondary | ICD-10-CM | POA: Diagnosis not present

## 2016-01-08 DIAGNOSIS — Z1389 Encounter for screening for other disorder: Secondary | ICD-10-CM | POA: Diagnosis not present

## 2016-01-08 DIAGNOSIS — Z6836 Body mass index (BMI) 36.0-36.9, adult: Secondary | ICD-10-CM | POA: Diagnosis not present

## 2016-01-15 DIAGNOSIS — Z0000 Encounter for general adult medical examination without abnormal findings: Secondary | ICD-10-CM | POA: Diagnosis not present

## 2016-01-15 DIAGNOSIS — E785 Hyperlipidemia, unspecified: Secondary | ICD-10-CM | POA: Diagnosis not present

## 2016-03-22 DIAGNOSIS — H2513 Age-related nuclear cataract, bilateral: Secondary | ICD-10-CM | POA: Diagnosis not present

## 2016-08-18 ENCOUNTER — Other Ambulatory Visit (HOSPITAL_COMMUNITY): Payer: Self-pay | Admitting: Internal Medicine

## 2016-08-18 ENCOUNTER — Ambulatory Visit (HOSPITAL_COMMUNITY)
Admission: RE | Admit: 2016-08-18 | Discharge: 2016-08-18 | Disposition: A | Discharge status: Home / Self Care | Payer: Medicare Other | Source: Ambulatory Visit | Attending: Internal Medicine | Admitting: Internal Medicine

## 2016-08-18 DIAGNOSIS — Z1389 Encounter for screening for other disorder: Secondary | ICD-10-CM | POA: Diagnosis not present

## 2016-08-18 DIAGNOSIS — M7989 Other specified soft tissue disorders: Secondary | ICD-10-CM

## 2016-08-18 DIAGNOSIS — Z6836 Body mass index (BMI) 36.0-36.9, adult: Secondary | ICD-10-CM | POA: Diagnosis not present

## 2016-08-18 DIAGNOSIS — I872 Venous insufficiency (chronic) (peripheral): Secondary | ICD-10-CM | POA: Diagnosis not present

## 2016-09-21 ENCOUNTER — Telehealth: Payer: Self-pay

## 2016-09-21 NOTE — Telephone Encounter (Signed)
SENT NOTES TO SCHEDULING 

## 2016-09-26 ENCOUNTER — Telehealth: Payer: Self-pay | Admitting: *Deleted

## 2016-09-26 NOTE — Telephone Encounter (Signed)
Left message X 3 regarding appointment for this patient.  She needs to be scheduled with [facility] for Vein Treatment Eval. [phone]).  Ask that Marcie Bal return my call to confirm.

## 2016-10-11 DIAGNOSIS — H2511 Age-related nuclear cataract, right eye: Secondary | ICD-10-CM | POA: Diagnosis not present

## 2016-10-24 HISTORY — PX: CATARACT EXTRACTION: SUR2

## 2016-11-16 DIAGNOSIS — H25811 Combined forms of age-related cataract, right eye: Secondary | ICD-10-CM | POA: Diagnosis not present

## 2016-11-16 DIAGNOSIS — H2511 Age-related nuclear cataract, right eye: Secondary | ICD-10-CM | POA: Diagnosis not present

## 2016-11-18 IMAGING — US US EXTREM LOW VENOUS*L*
1 series · 13 of 24 positions shown · non-contrast
Comparison: None.

CLINICAL DATA: Left lower extremity pain and swelling for 2 weeks.



[Series 1: us extrem low venous*left* · 0.10mm/px · 13 of 63 slices shown]
[im 1/63]
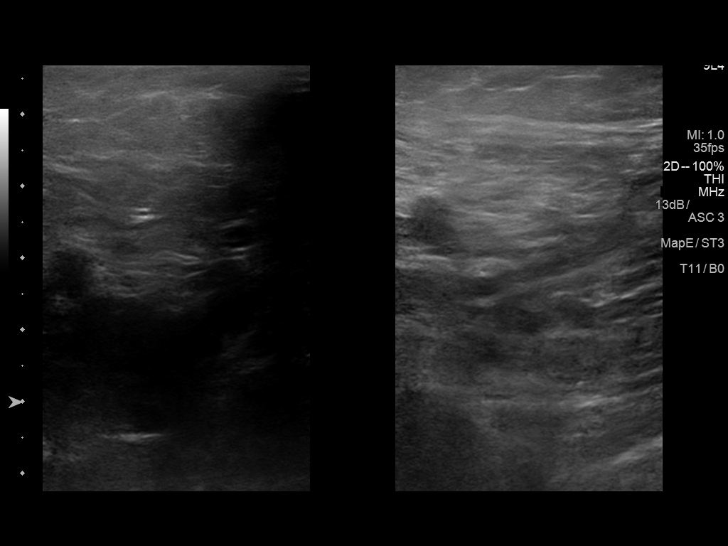
[im 6/63]
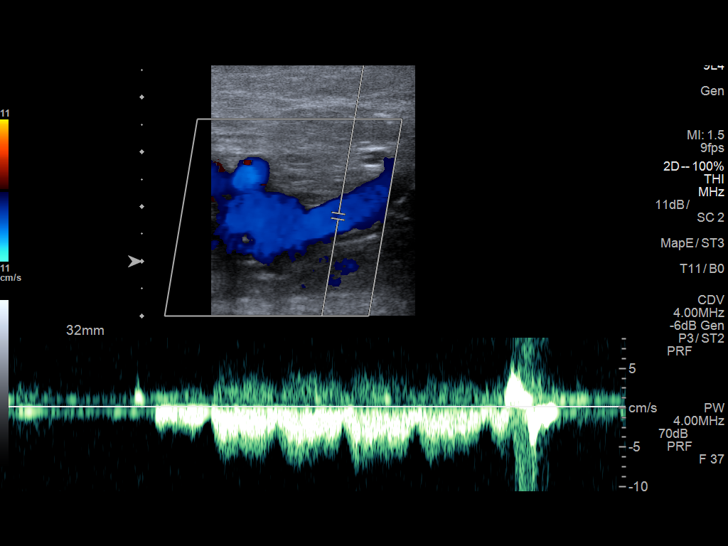
[im 11/63]
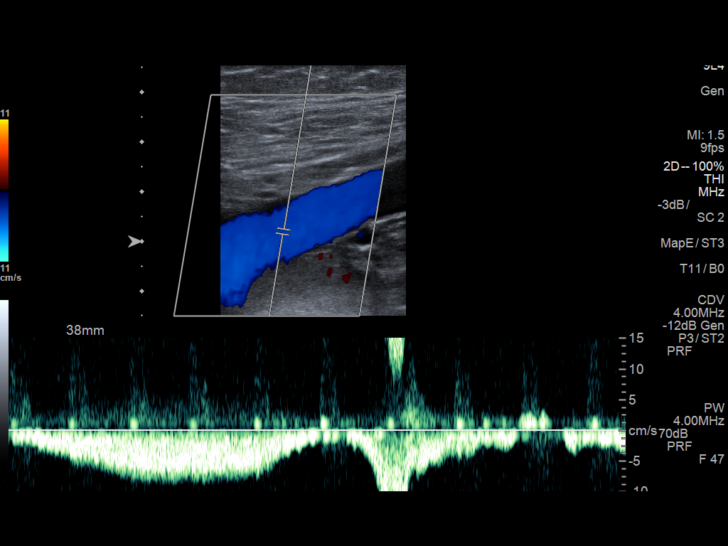
[im 17/63]
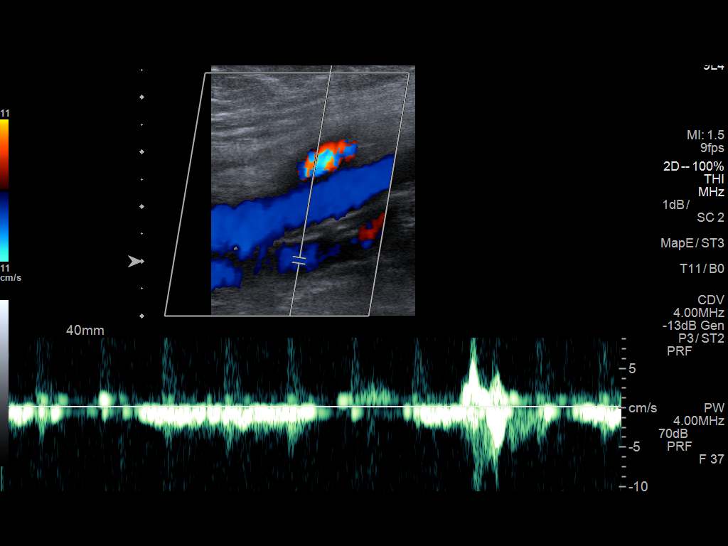
[im 22/63]
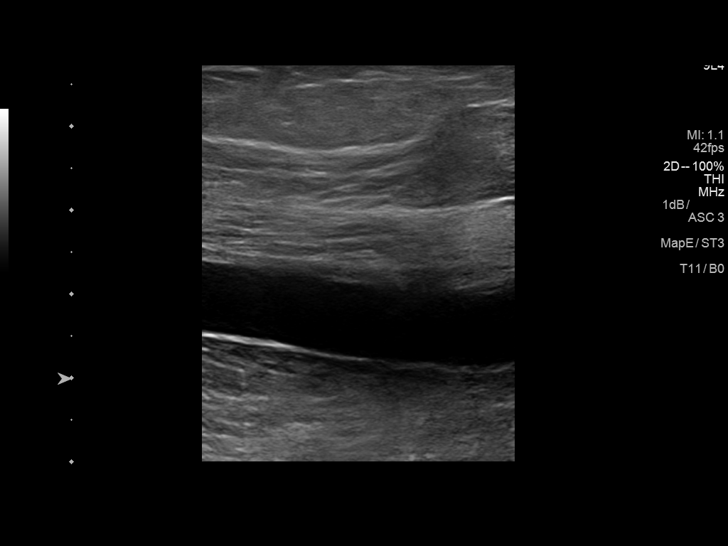
[im 27/63]
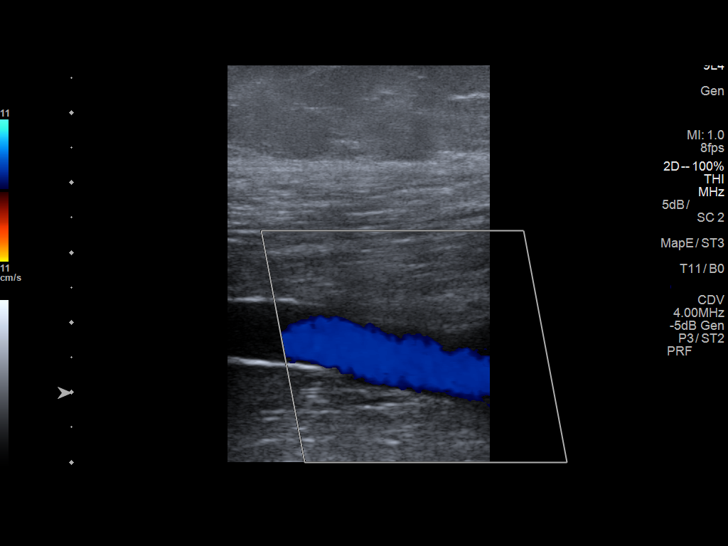
[im 33/63]
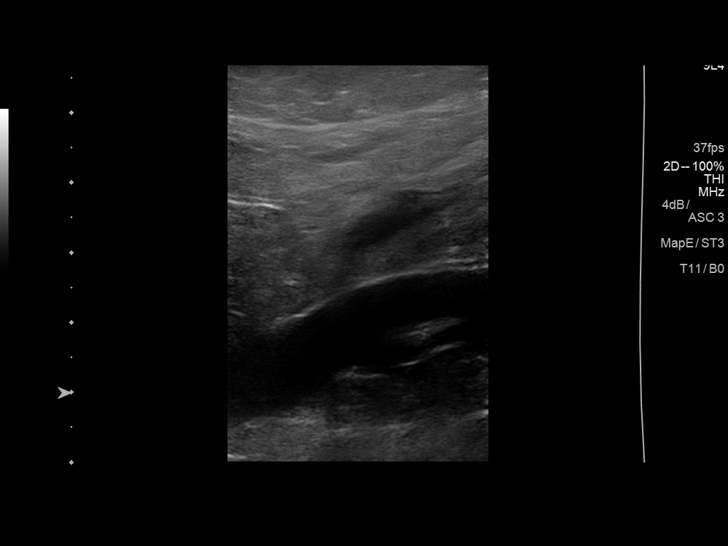
[im 36/63]
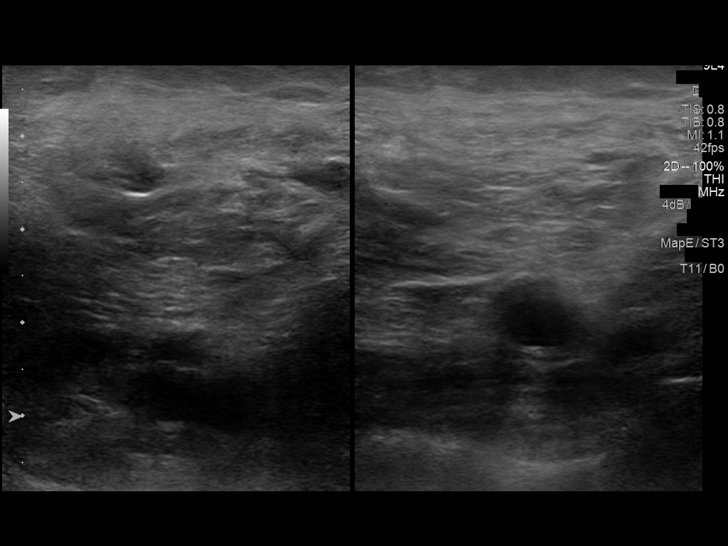
[im 41/63]
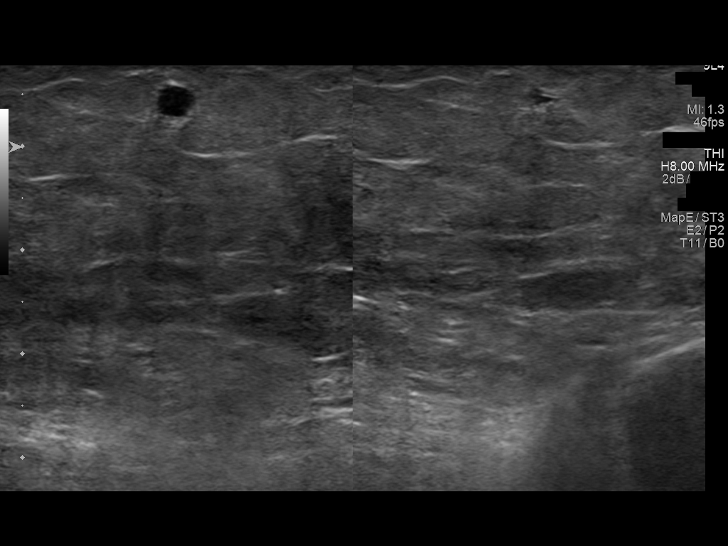
[im 46/63]
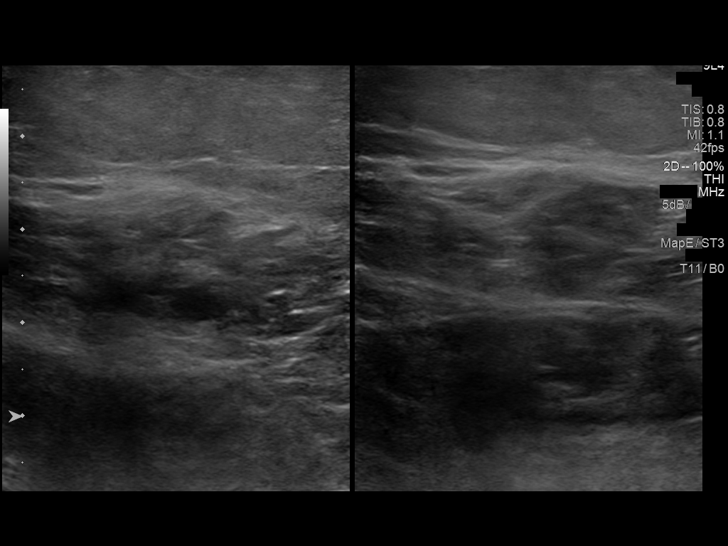
[im 52/63]
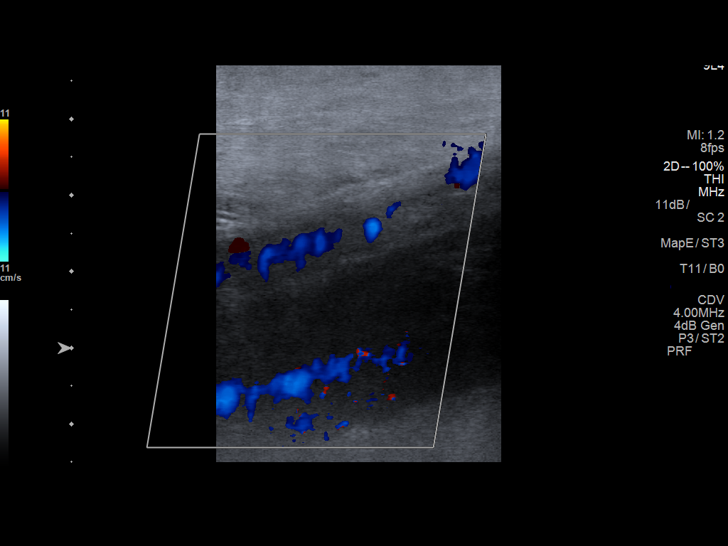
[im 57/63]
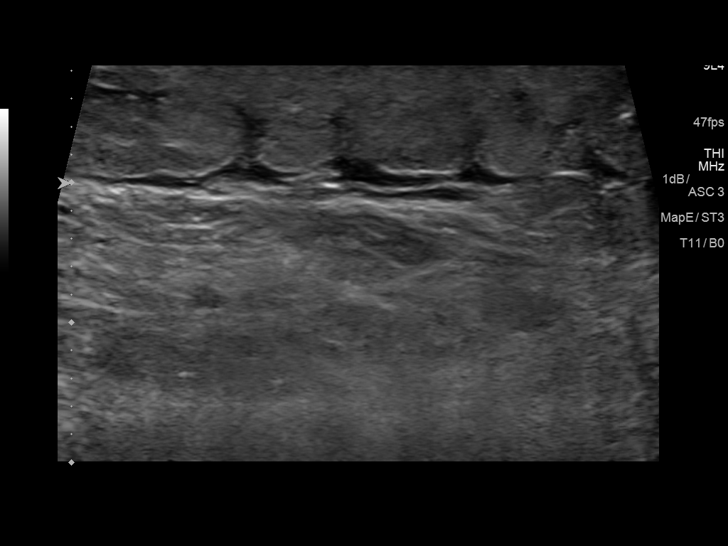
[im 63/63]
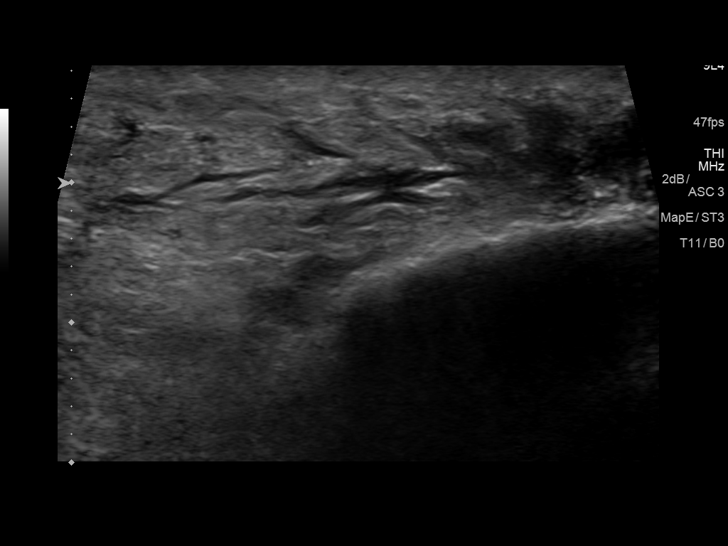

[13 of 24 positions shown; findings below may reference images not displayed]

FINDINGS: Contralateral Common Femoral Vein: Respiratory phasicity is normal
and symmetric with the symptomatic side. No evidence of thrombus.
Normal compressibility.

Common Femoral Vein: No evidence of thrombus. Normal
compressibility, respiratory phasicity and response to augmentation.

Saphenofemoral Junction: No evidence of thrombus. Normal
compressibility and flow on color Doppler imaging.

Profunda Femoral Vein: No evidence of thrombus. Normal
compressibility and flow on color Doppler imaging.

Femoral Vein: No evidence of thrombus. Normal compressibility,
respiratory phasicity and response to augmentation.

Popliteal Vein: No evidence of thrombus. Normal compressibility,
respiratory phasicity and response to augmentation.

Calf Veins: No evidence of thrombus. Normal compressibility and flow
on color Doppler imaging.

Superficial Great Saphenous Vein: No evidence of thrombus. Normal
compressibility and flow on color Doppler imaging.

Venous Reflux:  None.

Other Findings: Left lower extremity calf and ankle edema noted.
Small incidental sub surface varicosities at the ankle without
thrombosis.
IMPRESSION: No evidence of deep venous thrombosis.

## 2016-11-22 ENCOUNTER — Encounter: Payer: Self-pay | Admitting: Vascular Surgery

## 2016-11-22 DIAGNOSIS — Z23 Encounter for immunization: Secondary | ICD-10-CM | POA: Diagnosis not present

## 2016-11-28 ENCOUNTER — Other Ambulatory Visit: Payer: Self-pay | Admitting: *Deleted

## 2016-11-28 DIAGNOSIS — M79605 Pain in left leg: Secondary | ICD-10-CM

## 2016-11-28 DIAGNOSIS — M7989 Other specified soft tissue disorders: Principal | ICD-10-CM

## 2016-11-29 ENCOUNTER — Encounter: Payer: Self-pay | Admitting: Vascular Surgery

## 2016-11-29 ENCOUNTER — Ambulatory Visit (HOSPITAL_COMMUNITY)
Admission: RE | Admit: 2016-11-29 | Discharge: 2016-11-29 | Disposition: A | Discharge status: Home / Self Care | Payer: Medicare Other | Source: Ambulatory Visit | Attending: Vascular Surgery | Admitting: Vascular Surgery

## 2016-11-29 ENCOUNTER — Ambulatory Visit (INDEPENDENT_AMBULATORY_CARE_PROVIDER_SITE_OTHER): Payer: Medicare Other | Admitting: Vascular Surgery

## 2016-11-29 VITALS — BP 185/97 | HR 88 | Temp 97.4°F | Resp 16 | Ht 66.0 in | Wt 220.0 lb

## 2016-11-29 DIAGNOSIS — I83892 Varicose veins of left lower extremities with other complications: Secondary | ICD-10-CM | POA: Diagnosis not present

## 2016-11-29 DIAGNOSIS — M7989 Other specified soft tissue disorders: Secondary | ICD-10-CM | POA: Diagnosis not present

## 2016-11-29 DIAGNOSIS — I868 Varicose veins of other specified sites: Secondary | ICD-10-CM

## 2016-11-29 DIAGNOSIS — M79605 Pain in left leg: Secondary | ICD-10-CM | POA: Diagnosis not present

## 2016-11-29 NOTE — Progress Notes (Signed)
Subjective:     Patient ID: Theresa Pierce, female   DOB: 1946-04-25, 71 y.o.   MRN: [id number]  HPI This 71 year old female was evaluated today for pain and swelling in the left leg. She was referred by Dr. Gerarda Fraction. She previously had laser ablation bilateral great saphenous veins by Dr. Elisabeth Cara in 2012. She did fracture her left ankle in the interim. She has had increasing swelling in the left ankle and foot. She has also noticed some darkening of the skin in the lower part of the left leg. She has not noticed bulging varicosities in the left leg. She has no history of DVT thrombophlebitis stasis ulcers or bleeding. She occasionally wears a short leg elastic compression stockings which improved her situation slightly.  History reviewed. No pertinent past medical history.  Social History  Substance Use Topics  . Smoking status: Never Smoker  . Smokeless tobacco: Not on file  . Alcohol use No    Family History  Problem Relation Age of Onset  . Heart disease    . Cancer    . Arthritis      Allergies  Allergen Reactions  . Codeine Itching     Current Outpatient Prescriptions:  .  HYDROcodone-acetaminophen (NORCO/VICODIN) 5-325 MG per tablet, Take 1 tablet by mouth every 4 (four) hours as needed for pain. (Patient not taking: Reported on 11/29/2016), Disp: 20 tablet, Rfl: 0 .  HYDROcodone-acetaminophen (NORCO/VICODIN) 5-325 MG per tablet, Take 1 tablet by mouth every 4 (four) hours as needed for pain. (Patient not taking: Reported on 11/29/2016), Disp: 4 tablet, Rfl: 0  Vitals:   11/29/16 1406  BP: (!) 185/97  Pulse: 88  Resp: 16  Temp: 97.4 F (36.3 C)  SpO2: 99%  Weight: 220 lb (99.8 kg)  Height: 5\' 6"  (1.676 m)    Body mass index is 35.51 kg/m.         Review of Systems Denies chest pain, dyspnea on exertion, PND, orthopnea, hemoptysis location.vs    Gen.-alert and oriented x3 in no apparent distress HEENT normal for age Lungs no rhonchi or  wheezing Cardiovascular regular rhythm no murmurs carotid pulses 3+ palpable no bruits audible Abdomen soft nontender no palpable masses Musculoskeletal free of  major deformities Skin clear -no rashes Neurologic normal Lower extremities 3+ femoral and dorsalis pedis pulses palpable bilaterally with no edema was edema on the left from the midcalf distally. Hyperpigmentation lower third left leg circumflex with no active ulceration. Lesser degree of hyperpigmentation on the right. No bulging varicosities on the left a few small varicosities in the right medial thigh.  Today I ordered a venous duplex exam the left leg which I reviewed and interpreted. There is no DVT but there is significant deep vein reflux in the femoral vein and popliteal vein. 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 #3 diuretics as per indicated by medical doctor #4 return to see me on a when necessary basis-there is no indication  for any vascular intervention

## 2016-12-30 DIAGNOSIS — H2512 Age-related nuclear cataract, left eye: Secondary | ICD-10-CM | POA: Diagnosis not present

## 2017-01-11 DIAGNOSIS — H2512 Age-related nuclear cataract, left eye: Secondary | ICD-10-CM | POA: Diagnosis not present

## 2017-01-11 DIAGNOSIS — H52222 Regular astigmatism, left eye: Secondary | ICD-10-CM | POA: Diagnosis not present

## 2017-01-11 DIAGNOSIS — H25812 Combined forms of age-related cataract, left eye: Secondary | ICD-10-CM | POA: Diagnosis not present

## 2017-02-13 ENCOUNTER — Other Ambulatory Visit (HOSPITAL_COMMUNITY): Payer: Self-pay | Admitting: Internal Medicine

## 2017-02-13 DIAGNOSIS — Z1231 Encounter for screening mammogram for malignant neoplasm of breast: Secondary | ICD-10-CM

## 2017-02-16 ENCOUNTER — Ambulatory Visit (HOSPITAL_COMMUNITY)
Admission: RE | Admit: 2017-02-16 | Discharge: 2017-02-16 | Disposition: A | Discharge status: Home / Self Care | Payer: Medicare Other | Source: Ambulatory Visit | Attending: Internal Medicine | Admitting: Internal Medicine

## 2017-02-16 DIAGNOSIS — Z1231 Encounter for screening mammogram for malignant neoplasm of breast: Secondary | ICD-10-CM | POA: Insufficient documentation

## 2017-03-27 DIAGNOSIS — Z6834 Body mass index (BMI) 34.0-34.9, adult: Secondary | ICD-10-CM | POA: Diagnosis not present

## 2017-03-27 DIAGNOSIS — R05 Cough: Secondary | ICD-10-CM | POA: Diagnosis not present

## 2017-03-27 DIAGNOSIS — R509 Fever, unspecified: Secondary | ICD-10-CM | POA: Diagnosis not present

## 2017-03-27 DIAGNOSIS — Z1389 Encounter for screening for other disorder: Secondary | ICD-10-CM | POA: Diagnosis not present

## 2017-03-27 DIAGNOSIS — J019 Acute sinusitis, unspecified: Secondary | ICD-10-CM | POA: Diagnosis not present

## 2017-03-29 DIAGNOSIS — L03115 Cellulitis of right lower limb: Secondary | ICD-10-CM | POA: Diagnosis not present

## 2017-03-29 DIAGNOSIS — I872 Venous insufficiency (chronic) (peripheral): Secondary | ICD-10-CM | POA: Diagnosis not present

## 2017-03-29 DIAGNOSIS — M1991 Primary osteoarthritis, unspecified site: Secondary | ICD-10-CM | POA: Diagnosis not present

## 2017-03-29 DIAGNOSIS — E6609 Other obesity due to excess calories: Secondary | ICD-10-CM | POA: Diagnosis not present

## 2017-03-29 DIAGNOSIS — R600 Localized edema: Secondary | ICD-10-CM | POA: Diagnosis not present

## 2017-03-29 DIAGNOSIS — J9801 Acute bronchospasm: Secondary | ICD-10-CM | POA: Diagnosis not present

## 2017-03-29 DIAGNOSIS — J042 Acute laryngotracheitis: Secondary | ICD-10-CM | POA: Diagnosis not present

## 2017-03-29 DIAGNOSIS — Z6834 Body mass index (BMI) 34.0-34.9, adult: Secondary | ICD-10-CM | POA: Diagnosis not present

## 2017-03-29 DIAGNOSIS — J329 Chronic sinusitis, unspecified: Secondary | ICD-10-CM | POA: Diagnosis not present

## 2017-03-29 DIAGNOSIS — E669 Obesity, unspecified: Secondary | ICD-10-CM | POA: Diagnosis not present

## 2017-04-01 DIAGNOSIS — Z1211 Encounter for screening for malignant neoplasm of colon: Secondary | ICD-10-CM | POA: Diagnosis not present

## 2017-08-11 DIAGNOSIS — H04123 Dry eye syndrome of bilateral lacrimal glands: Secondary | ICD-10-CM | POA: Diagnosis not present

## 2017-08-11 DIAGNOSIS — H43812 Vitreous degeneration, left eye: Secondary | ICD-10-CM | POA: Diagnosis not present

## 2017-08-11 DIAGNOSIS — Z961 Presence of intraocular lens: Secondary | ICD-10-CM | POA: Diagnosis not present

## 2017-08-28 DIAGNOSIS — Z1389 Encounter for screening for other disorder: Secondary | ICD-10-CM | POA: Diagnosis not present

## 2017-08-28 DIAGNOSIS — E6609 Other obesity due to excess calories: Secondary | ICD-10-CM | POA: Diagnosis not present

## 2017-08-28 DIAGNOSIS — E785 Hyperlipidemia, unspecified: Secondary | ICD-10-CM | POA: Diagnosis not present

## 2017-08-28 DIAGNOSIS — Z23 Encounter for immunization: Secondary | ICD-10-CM | POA: Diagnosis not present

## 2017-08-28 DIAGNOSIS — Z6834 Body mass index (BMI) 34.0-34.9, adult: Secondary | ICD-10-CM | POA: Diagnosis not present

## 2017-08-28 DIAGNOSIS — Z0000 Encounter for general adult medical examination without abnormal findings: Secondary | ICD-10-CM | POA: Diagnosis not present

## 2017-08-29 DIAGNOSIS — R7309 Other abnormal glucose: Secondary | ICD-10-CM | POA: Diagnosis not present

## 2018-03-14 ENCOUNTER — Other Ambulatory Visit (HOSPITAL_COMMUNITY): Payer: Self-pay | Admitting: Internal Medicine

## 2018-03-14 DIAGNOSIS — K219 Gastro-esophageal reflux disease without esophagitis: Secondary | ICD-10-CM | POA: Diagnosis not present

## 2018-03-14 DIAGNOSIS — Z1389 Encounter for screening for other disorder: Secondary | ICD-10-CM | POA: Diagnosis not present

## 2018-03-14 DIAGNOSIS — R1310 Dysphagia, unspecified: Secondary | ICD-10-CM

## 2018-03-14 DIAGNOSIS — R490 Dysphonia: Secondary | ICD-10-CM | POA: Diagnosis not present

## 2018-03-14 DIAGNOSIS — Z6836 Body mass index (BMI) 36.0-36.9, adult: Secondary | ICD-10-CM | POA: Diagnosis not present

## 2018-03-21 ENCOUNTER — Other Ambulatory Visit (HOSPITAL_COMMUNITY): Payer: Self-pay | Admitting: Internal Medicine

## 2018-03-21 ENCOUNTER — Ambulatory Visit (HOSPITAL_COMMUNITY)
Admission: RE | Admit: 2018-03-21 | Discharge: 2018-03-21 | Disposition: A | Discharge status: Home / Self Care | Payer: Medicare Other | Source: Ambulatory Visit | Attending: Internal Medicine | Admitting: Internal Medicine

## 2018-03-21 DIAGNOSIS — R1310 Dysphagia, unspecified: Secondary | ICD-10-CM | POA: Diagnosis not present

## 2018-03-21 DIAGNOSIS — Z1231 Encounter for screening mammogram for malignant neoplasm of breast: Secondary | ICD-10-CM

## 2018-03-21 DIAGNOSIS — K224 Dyskinesia of esophagus: Secondary | ICD-10-CM | POA: Insufficient documentation

## 2018-03-26 ENCOUNTER — Encounter (HOSPITAL_COMMUNITY): Payer: Self-pay

## 2018-03-26 ENCOUNTER — Ambulatory Visit (HOSPITAL_COMMUNITY)
Admission: RE | Admit: 2018-03-26 | Discharge: 2018-03-26 | Disposition: A | Discharge status: Home / Self Care | Payer: Medicare Other | Source: Ambulatory Visit | Attending: Internal Medicine | Admitting: Internal Medicine

## 2018-03-26 DIAGNOSIS — Z1231 Encounter for screening mammogram for malignant neoplasm of breast: Secondary | ICD-10-CM | POA: Diagnosis not present

## 2018-03-28 ENCOUNTER — Ambulatory Visit (HOSPITAL_COMMUNITY): Payer: Medicare Other

## 2018-04-23 ENCOUNTER — Encounter (HOSPITAL_COMMUNITY): Payer: Self-pay | Admitting: Emergency Medicine

## 2018-04-23 ENCOUNTER — Other Ambulatory Visit: Payer: Self-pay

## 2018-04-23 ENCOUNTER — Emergency Department (HOSPITAL_COMMUNITY): Payer: Medicare Other

## 2018-04-23 ENCOUNTER — Emergency Department (HOSPITAL_COMMUNITY)
Admission: EM | Admit: 2018-04-23 | Discharge: 2018-04-23 | Disposition: A | Discharge status: Home / Self Care | Payer: Medicare Other | Attending: Emergency Medicine | Admitting: Emergency Medicine

## 2018-04-23 DIAGNOSIS — I10 Essential (primary) hypertension: Secondary | ICD-10-CM | POA: Diagnosis not present

## 2018-04-23 DIAGNOSIS — M1711 Unilateral primary osteoarthritis, right knee: Secondary | ICD-10-CM | POA: Diagnosis not present

## 2018-04-23 DIAGNOSIS — Z79899 Other long term (current) drug therapy: Secondary | ICD-10-CM | POA: Diagnosis not present

## 2018-04-23 DIAGNOSIS — S8991XA Unspecified injury of right lower leg, initial encounter: Secondary | ICD-10-CM | POA: Diagnosis not present

## 2018-04-23 DIAGNOSIS — M25561 Pain in right knee: Secondary | ICD-10-CM | POA: Diagnosis not present

## 2018-04-23 HISTORY — DX: Other fracture of unspecified lower leg, initial encounter for closed fracture: S82.899A

## 2018-04-23 MED ORDER — TRAMADOL HCL 50 MG PO TABS: ORAL_TABLET | ORAL | 0 refills | Status: DC

## 2018-04-23 NOTE — Discharge Instructions (Addendum)
Please apply knee sleeve to your right knee daily until seen by the orthopedic specialist.  Please use Tylenol extra strength for mild pain.  Use Ultram for more severe pain.  Ultram may cause drowsiness, please use caution getting around if taking that medication.  Please do not drive a vehicle or operate machinery or handle sharp objects when taking this medication.  Please use your walker until seen by the orthopedic specialist.

## 2018-04-23 NOTE — ED Triage Notes (Signed)
Pt twisted right knee last night and c/o pain since. Swelling noted. States unable to put weight on it.

## 2018-04-23 NOTE — ED Provider Notes (Signed)
[facility] EMERGENCY DEPARTMENT Provider Note   CSN: [id number] Arrival date & time: 04/23/18  1003     History   Chief Complaint Chief Complaint  Patient presents with  . Knee Pain    HPI KEELIA GRAYBILL is a 72 y.o. female.  Patient is a 72 year old female who presents to the emergency department with a complaint of right knee pain.  Patient states that on yesterday she was doing some work in her garden, she turned, twisted, and felt severe pain in her knee.  She rested her knee, but was up and down through the night because of knee pain.  The pain continued this morning and she was unable to bear weight on it she was able unable to walk on it so she came to the emergency department for additional evaluation.  The patient states that she has had problems with arthritis and meniscal tear in her left knee in the past, and this knee feels a lot similar.  No other pain or injury reported at this time.     Past Medical History:  Diagnosis Date  . Broken ankle     Patient Active Problem List   Diagnosis Date Noted  . Varicose veins of left lower extremity with complications 22/29/7989  . Ankle fracture 01/29/2013  . Lateral malleolar fracture 10/31/2012    Past Surgical History:  Procedure Laterality Date  . laser ablations bilat GSV 2012 Bilateral 2012     OB History   None      Home Medications    Prior to Admission medications   Medication Sig Start Date End Date Taking? Authorizing Provider  Biotin 1 MG CAPS Take 1 capsule by mouth daily.   Yes [provider]  glucosamine-chondroitin 500-400 MG tablet Take 1 tablet by mouth 3 (three) times daily.   Yes [provider]  ibuprofen (ADVIL,MOTRIN) 200 MG tablet Take 200 mg by mouth every 6 (six) hours as needed.   Yes [provider]  vitamin C (ASCORBIC ACID) 250 MG tablet Take 250 mg by mouth daily.   Yes [provider]    Family History Family History  Problem Relation  Age of Onset  . Heart disease Unknown   . Cancer Unknown   . Arthritis Unknown     Social History Social History   Tobacco Use  . Smoking status: Never Smoker  . Smokeless tobacco: Never Used  Substance Use Topics  . Alcohol use: No  . Drug use: No     Allergies   Amoxicillin and Codeine   Review of Systems Review of Systems  Constitutional: Negative for activity change.       All ROS Neg except as noted in HPI  HENT: Negative for nosebleeds.   Eyes: Negative for photophobia and discharge.  Respiratory: Negative for cough, shortness of breath and wheezing.   Cardiovascular: Negative for chest pain and palpitations.  Gastrointestinal: Negative for abdominal pain and blood in stool.  Genitourinary: Negative for dysuria, frequency and hematuria.  Musculoskeletal: Positive for arthralgias. Negative for back pain and neck pain.       Knee pain  Skin: Negative.   Neurological: Negative for dizziness, seizures and speech difficulty.  Psychiatric/Behavioral: Negative for confusion and hallucinations.     Physical Exam Updated Vital Signs BP (!) 197/77 (BP Location: Left Arm) Comment: x2 and both elevated.   Pulse 75   Temp 97.6 F (36.4 C) (Oral)   Resp 19   SpO2 100%   Physical  Exam  Constitutional: She is oriented to person, place, and time. She appears well-developed and well-nourished.  Non-toxic appearance.  HENT:  Head: Normocephalic.  Right Ear: Tympanic membrane and external ear normal.  Left Ear: Tympanic membrane and external ear normal.  Eyes: Pupils are equal, round, and reactive to light. EOM and lids are normal.  Neck: Normal range of motion. Neck supple. Carotid bruit is not present.  Cardiovascular: Normal rate, regular rhythm, normal heart sounds, intact distal pulses and normal pulses.  Pulmonary/Chest: Breath sounds normal. No respiratory distress.  Abdominal: Soft. Bowel sounds are normal. There is no tenderness. There is no guarding.    Musculoskeletal: Normal range of motion.  Patient reports right knee feels better after having been here in the emergency department, there is no deformity of the quadricep area.  There are degenerative changes of the right knee.  There is no palpable effusion appreciated.  No hot joint noted.  And there is no posterior mass of the right knee.  The anterior tibial tuberosity is not hot and there does not appear to be any palpable deformity of the patella tendon.  The patient has multiple varicose veins.  The dorsalis pedis pulses 2+.  Capillary refill on the right is less than 3 seconds.  There is crepitus with flexion and extension of the right knee.  Lymphadenopathy:       Head (right side): No submandibular adenopathy present.       Head (left side): No submandibular adenopathy present.    She has no cervical adenopathy.  Neurological: She is alert and oriented to person, place, and time. She has normal strength. No cranial nerve deficit or sensory deficit.  Skin: Skin is warm and dry.  Psychiatric: She has a normal mood and affect. Her speech is normal.  Nursing note and vitals reviewed.    ED Treatments / Results  Labs (all labs ordered are listed, but only abnormal results are displayed) Labs Reviewed - No data to display  EKG None  Radiology Dg Knee Complete 4 Views Right  Result Date: 04/23/2018 CLINICAL DATA:  Twisting injury yesterday with knee pain, initial encounter EXAM: RIGHT KNEE - COMPLETE 4+ VIEW COMPARISON:  None. FINDINGS: Tricompartmental degenerative changes are noted most marked in the lateral joint space. No joint effusion is seen. No acute fracture or dislocation is noted. IMPRESSION: Degenerative change without acute abnormality. Electronically Signed   By: Inez Catalina M.D.   On: 04/23/2018 11:03    Procedures Procedures (including critical care time)  Medications Ordered in ED Medications - No data to display   Initial Impression / Assessment and Plan /  ED Course  I have reviewed the triage vital signs and the nursing notes.  Pertinent labs & imaging results that were available during my care of the patient were reviewed by me and considered in my medical decision making (see chart for details).      Final Clinical Impressions(s) / ED Diagnoses MDM  Blood pressure is elevated at 197/77.  Patient has a history of hypertension.  I have asked the patient have this rechecked soon.  The patient x-ray shows no fracture, no dislocation, and no effusion.  It does show however advanced tricompartment degenerative changes.  The patient will be fitted with a knee sleeve.  I have asked the patient to use a walker until she is seen by Dr. Aline Brochure for orthopedic evaluation.  The patient will use Ultram for pain not improved by Tylenol.  Patient is in agreement  with this plan.   Final diagnoses:  Primary osteoarthritis of right knee    ED Discharge Orders        Ordered    traMADol (ULTRAM) 50 MG tablet     04/23/18 1155       Lily Kocher, PA-C 04/23/18 1155    Hayden Rasmussen, MD 04/24/18 1052

## 2018-05-07 ENCOUNTER — Ambulatory Visit (INDEPENDENT_AMBULATORY_CARE_PROVIDER_SITE_OTHER): Payer: Medicare Other | Admitting: Otolaryngology

## 2018-05-07 DIAGNOSIS — R490 Dysphonia: Secondary | ICD-10-CM | POA: Diagnosis not present

## 2018-05-07 DIAGNOSIS — K219 Gastro-esophageal reflux disease without esophagitis: Secondary | ICD-10-CM | POA: Diagnosis not present

## 2018-06-18 ENCOUNTER — Ambulatory Visit (INDEPENDENT_AMBULATORY_CARE_PROVIDER_SITE_OTHER): Payer: Medicare Other | Admitting: Otolaryngology

## 2018-06-18 DIAGNOSIS — K219 Gastro-esophageal reflux disease without esophagitis: Secondary | ICD-10-CM

## 2018-06-18 DIAGNOSIS — R490 Dysphonia: Secondary | ICD-10-CM | POA: Diagnosis not present

## 2018-08-17 DIAGNOSIS — H04123 Dry eye syndrome of bilateral lacrimal glands: Secondary | ICD-10-CM | POA: Diagnosis not present

## 2018-08-17 DIAGNOSIS — H43812 Vitreous degeneration, left eye: Secondary | ICD-10-CM | POA: Diagnosis not present

## 2018-08-17 DIAGNOSIS — Z961 Presence of intraocular lens: Secondary | ICD-10-CM | POA: Diagnosis not present

## 2018-08-20 ENCOUNTER — Ambulatory Visit (INDEPENDENT_AMBULATORY_CARE_PROVIDER_SITE_OTHER): Payer: Medicare Other | Admitting: Otolaryngology

## 2018-08-20 DIAGNOSIS — K219 Gastro-esophageal reflux disease without esophagitis: Secondary | ICD-10-CM

## 2018-08-20 DIAGNOSIS — R490 Dysphonia: Secondary | ICD-10-CM

## 2018-08-20 DIAGNOSIS — R05 Cough: Secondary | ICD-10-CM

## 2018-08-22 DIAGNOSIS — Z6835 Body mass index (BMI) 35.0-35.9, adult: Secondary | ICD-10-CM | POA: Diagnosis not present

## 2018-08-22 DIAGNOSIS — N342 Other urethritis: Secondary | ICD-10-CM | POA: Diagnosis not present

## 2018-08-22 DIAGNOSIS — E6609 Other obesity due to excess calories: Secondary | ICD-10-CM | POA: Diagnosis not present

## 2018-09-02 DIAGNOSIS — Z1211 Encounter for screening for malignant neoplasm of colon: Secondary | ICD-10-CM | POA: Diagnosis not present

## 2018-09-03 DIAGNOSIS — Z6835 Body mass index (BMI) 35.0-35.9, adult: Secondary | ICD-10-CM | POA: Diagnosis not present

## 2018-09-03 DIAGNOSIS — R946 Abnormal results of thyroid function studies: Secondary | ICD-10-CM | POA: Diagnosis not present

## 2018-09-03 DIAGNOSIS — Z0001 Encounter for general adult medical examination with abnormal findings: Secondary | ICD-10-CM | POA: Diagnosis not present

## 2018-09-03 DIAGNOSIS — E6609 Other obesity due to excess calories: Secondary | ICD-10-CM | POA: Diagnosis not present

## 2018-09-03 DIAGNOSIS — Z1389 Encounter for screening for other disorder: Secondary | ICD-10-CM | POA: Diagnosis not present

## 2018-09-03 DIAGNOSIS — Z23 Encounter for immunization: Secondary | ICD-10-CM | POA: Diagnosis not present

## 2018-09-03 DIAGNOSIS — E785 Hyperlipidemia, unspecified: Secondary | ICD-10-CM | POA: Diagnosis not present

## 2018-11-19 ENCOUNTER — Ambulatory Visit (INDEPENDENT_AMBULATORY_CARE_PROVIDER_SITE_OTHER): Payer: Medicare Other | Admitting: Otolaryngology

## 2018-11-19 DIAGNOSIS — R05 Cough: Secondary | ICD-10-CM | POA: Diagnosis not present

## 2018-11-19 DIAGNOSIS — R490 Dysphonia: Secondary | ICD-10-CM | POA: Diagnosis not present

## 2018-12-17 ENCOUNTER — Ambulatory Visit (INDEPENDENT_AMBULATORY_CARE_PROVIDER_SITE_OTHER): Payer: Medicare Other | Admitting: Otolaryngology

## 2018-12-17 DIAGNOSIS — K219 Gastro-esophageal reflux disease without esophagitis: Secondary | ICD-10-CM | POA: Diagnosis not present

## 2018-12-17 DIAGNOSIS — R490 Dysphonia: Secondary | ICD-10-CM | POA: Diagnosis not present

## 2019-03-12 ENCOUNTER — Inpatient Hospital Stay (HOSPITAL_COMMUNITY)
Admission: EM | Admit: 2019-03-12 | Discharge: 2019-03-15 | DRG: 982 | Disposition: A | Discharge status: Home / Self Care | Payer: Medicare Other | Attending: Orthopedic Surgery | Admitting: Orthopedic Surgery

## 2019-03-12 ENCOUNTER — Emergency Department (HOSPITAL_COMMUNITY): Payer: Medicare Other

## 2019-03-12 ENCOUNTER — Other Ambulatory Visit: Payer: Self-pay

## 2019-03-12 ENCOUNTER — Emergency Department (HOSPITAL_COMMUNITY): Payer: Medicare Other | Admitting: Certified Registered"

## 2019-03-12 ENCOUNTER — Encounter (HOSPITAL_COMMUNITY): Payer: Self-pay

## 2019-03-12 ENCOUNTER — Encounter (HOSPITAL_COMMUNITY)
Admission: EM | Disposition: A | Discharge status: Home / Self Care | Payer: Self-pay | Source: Home / Self Care | Attending: Orthopedic Surgery

## 2019-03-12 DIAGNOSIS — Z79899 Other long term (current) drug therapy: Secondary | ICD-10-CM | POA: Diagnosis not present

## 2019-03-12 DIAGNOSIS — Z03818 Encounter for observation for suspected exposure to other biological agents ruled out: Secondary | ICD-10-CM | POA: Diagnosis not present

## 2019-03-12 DIAGNOSIS — L03119 Cellulitis of unspecified part of limb: Secondary | ICD-10-CM

## 2019-03-12 DIAGNOSIS — Z885 Allergy status to narcotic agent status: Secondary | ICD-10-CM

## 2019-03-12 DIAGNOSIS — L03114 Cellulitis of left upper limb: Secondary | ICD-10-CM | POA: Diagnosis present

## 2019-03-12 DIAGNOSIS — Z23 Encounter for immunization: Secondary | ICD-10-CM

## 2019-03-12 DIAGNOSIS — Z6834 Body mass index (BMI) 34.0-34.9, adult: Secondary | ICD-10-CM

## 2019-03-12 DIAGNOSIS — Z6835 Body mass index (BMI) 35.0-35.9, adult: Secondary | ICD-10-CM | POA: Diagnosis not present

## 2019-03-12 DIAGNOSIS — S61259A Open bite of unspecified finger without damage to nail, initial encounter: Secondary | ICD-10-CM | POA: Diagnosis present

## 2019-03-12 DIAGNOSIS — L02512 Cutaneous abscess of left hand: Secondary | ICD-10-CM | POA: Diagnosis not present

## 2019-03-12 DIAGNOSIS — Z1159 Encounter for screening for other viral diseases: Secondary | ICD-10-CM | POA: Diagnosis not present

## 2019-03-12 DIAGNOSIS — Z8261 Family history of arthritis: Secondary | ICD-10-CM

## 2019-03-12 DIAGNOSIS — E669 Obesity, unspecified: Secondary | ICD-10-CM | POA: Diagnosis present

## 2019-03-12 DIAGNOSIS — L02519 Cutaneous abscess of unspecified hand: Secondary | ICD-10-CM

## 2019-03-12 DIAGNOSIS — S61452A Open bite of left hand, initial encounter: Principal | ICD-10-CM | POA: Diagnosis present

## 2019-03-12 DIAGNOSIS — Z8249 Family history of ischemic heart disease and other diseases of the circulatory system: Secondary | ICD-10-CM | POA: Diagnosis not present

## 2019-03-12 DIAGNOSIS — Z1389 Encounter for screening for other disorder: Secondary | ICD-10-CM | POA: Diagnosis not present

## 2019-03-12 DIAGNOSIS — E6609 Other obesity due to excess calories: Secondary | ICD-10-CM | POA: Diagnosis not present

## 2019-03-12 DIAGNOSIS — W5501XA Bitten by cat, initial encounter: Secondary | ICD-10-CM | POA: Diagnosis present

## 2019-03-12 DIAGNOSIS — Z880 Allergy status to penicillin: Secondary | ICD-10-CM | POA: Diagnosis not present

## 2019-03-12 DIAGNOSIS — M65842 Other synovitis and tenosynovitis, left hand: Secondary | ICD-10-CM | POA: Diagnosis not present

## 2019-03-12 DIAGNOSIS — A280 Pasteurellosis: Secondary | ICD-10-CM | POA: Diagnosis not present

## 2019-03-12 HISTORY — PX: I & D EXTREMITY: SHX5045

## 2019-03-12 LAB — CBC WITH DIFFERENTIAL/PLATELET
Abs Immature Granulocytes: 0.04 10*3/uL (ref 0.00–0.07)
Basophils Absolute: 0 10*3/uL (ref 0.0–0.1)
Basophils Relative: 0 %
Eosinophils Absolute: 0.1 10*3/uL (ref 0.0–0.5)
Eosinophils Relative: 1 %
HCT: 45.4 % (ref 36.0–46.0)
Hemoglobin: 15.1 g/dL — ABNORMAL HIGH (ref 12.0–15.0)
Immature Granulocytes: 0 %
Lymphocytes Relative: 16 %
Lymphs Abs: 1.6 10*3/uL (ref 0.7–4.0)
MCH: 31.4 pg (ref 26.0–34.0)
MCHC: 33.3 g/dL (ref 30.0–36.0)
MCV: 94.4 fL (ref 80.0–100.0)
Monocytes Absolute: 0.8 10*3/uL (ref 0.1–1.0)
Monocytes Relative: 9 %
Neutro Abs: 7.2 10*3/uL (ref 1.7–7.7)
Neutrophils Relative %: 74 %
Platelets: 197 10*3/uL (ref 150–400)
RBC: 4.81 MIL/uL (ref 3.87–5.11)
RDW: 12.2 % (ref 11.5–15.5)
WBC: 9.8 10*3/uL (ref 4.0–10.5)
nRBC: 0 % (ref 0.0–0.2)

## 2019-03-12 LAB — BASIC METABOLIC PANEL
Anion gap: 10 (ref 5–15)
BUN: 9 mg/dL (ref 8–23)
CO2: 27 mmol/L (ref 22–32)
Calcium: 9.3 mg/dL (ref 8.9–10.3)
Chloride: 103 mmol/L (ref 98–111)
Creatinine, Ser: 0.76 mg/dL (ref 0.44–1.00)
GFR calc Af Amer: >60 mL/min (ref >60)
GFR calc non Af Amer: >60 mL/min (ref >60)
Glucose, Bld: 96 mg/dL (ref 70–99)
Potassium: 3.9 mmol/L (ref 3.5–5.1)
Sodium: 140 mmol/L (ref 135–145)

## 2019-03-12 LAB — PROTIME-INR
INR: 1 (ref 0.8–1.2)
Prothrombin Time: 13.3 seconds (ref 11.4–15.2)

## 2019-03-12 LAB — TYPE AND SCREEN
ABO/RH(D): AB POS
Antibody Screen: NEGATIVE

## 2019-03-12 LAB — SARS CORONAVIRUS 2 BY RT PCR (HOSPITAL ORDER, PERFORMED IN ~~LOC~~ HOSPITAL LAB): SARS Coronavirus 2: NEGATIVE

## 2019-03-12 LAB — ABO/RH: ABO/RH(D): AB POS

## 2019-03-12 SURGERY — IRRIGATION AND DEBRIDEMENT EXTREMITY
Anesthesia: General | Site: Arm Lower | Laterality: Left

## 2019-03-12 MED ORDER — PROPOFOL 10 MG/ML IV BOLUS
INTRAVENOUS | Status: AC
  Filled 2019-03-12: qty 20

## 2019-03-12 MED ORDER — METHOCARBAMOL 500 MG PO TABS
ORAL_TABLET | ORAL | Status: AC
  Filled 2019-03-12: qty 1

## 2019-03-12 MED ORDER — FENTANYL CITRATE (PF) 250 MCG/5ML IJ SOLN
INTRAMUSCULAR | Status: AC
  Filled 2019-03-12: qty 5

## 2019-03-12 MED ORDER — FENTANYL CITRATE (PF) 100 MCG/2ML IJ SOLN
INTRAMUSCULAR | Status: AC
  Filled 2019-03-12: qty 2

## 2019-03-12 MED ORDER — HYDROCODONE-ACETAMINOPHEN 7.5-325 MG PO TABS: 1.0000 | ORAL_TABLET | ORAL | Status: DC | PRN

## 2019-03-12 MED ORDER — LIDOCAINE 2% (20 MG/ML) 5 ML SYRINGE
INTRAMUSCULAR | Status: DC | PRN
  Administered 2019-03-12: 40 mg via INTRAVENOUS

## 2019-03-12 MED ORDER — FENTANYL CITRATE (PF) 100 MCG/2ML IJ SOLN
25.0000 ug | INTRAMUSCULAR | Status: DC | PRN
  Administered 2019-03-12 (×2): 50 ug via INTRAVENOUS

## 2019-03-12 MED ORDER — AMPICILLIN-SULBACTAM SODIUM 3 (2-1) G IJ SOLR
3.0000 g | Freq: Once | INTRAMUSCULAR | Status: AC
  Administered 2019-03-12: 3 g via INTRAVENOUS
  Filled 2019-03-12: qty 3

## 2019-03-12 MED ORDER — ONDANSETRON HCL 4 MG/2ML IJ SOLN
4.0000 mg | Freq: Four times a day (QID) | INTRAMUSCULAR | Status: DC | PRN
  Administered 2019-03-14: 4 mg via INTRAVENOUS
  Filled 2019-03-12: qty 2

## 2019-03-12 MED ORDER — ONDANSETRON HCL 4 MG PO TABS: 4.0000 mg | ORAL_TABLET | Freq: Four times a day (QID) | ORAL | Status: DC | PRN

## 2019-03-12 MED ORDER — LACTATED RINGERS IV SOLN
INTRAVENOUS | Status: DC | PRN
  Administered 2019-03-12: 20:00:00 via INTRAVENOUS

## 2019-03-12 MED ORDER — BUPIVACAINE HCL (PF) 0.25 % IJ SOLN
INTRAMUSCULAR | Status: AC
  Filled 2019-03-12: qty 10

## 2019-03-12 MED ORDER — SODIUM CHLORIDE 0.9 % IR SOLN
Status: DC | PRN
  Administered 2019-03-12: 3000 mL

## 2019-03-12 MED ORDER — PROMETHAZINE HCL 12.5 MG RE SUPP
12.5000 mg | Freq: Four times a day (QID) | RECTAL | Status: DC | PRN
  Filled 2019-03-12: qty 1

## 2019-03-12 MED ORDER — ROCURONIUM BROMIDE 10 MG/ML (PF) SYRINGE
PREFILLED_SYRINGE | INTRAVENOUS | Status: AC
  Filled 2019-03-12: qty 10

## 2019-03-12 MED ORDER — 0.9 % SODIUM CHLORIDE (POUR BTL) OPTIME
TOPICAL | Status: DC | PRN
  Administered 2019-03-12: 1000 mL

## 2019-03-12 MED ORDER — ONDANSETRON HCL 4 MG/2ML IJ SOLN
INTRAMUSCULAR | Status: AC
  Filled 2019-03-12: qty 6

## 2019-03-12 MED ORDER — FENTANYL CITRATE (PF) 100 MCG/2ML IJ SOLN
INTRAMUSCULAR | Status: DC | PRN
  Administered 2019-03-12 (×2): 50 ug via INTRAVENOUS

## 2019-03-12 MED ORDER — METHOCARBAMOL 500 MG PO TABS
500.0000 mg | ORAL_TABLET | Freq: Four times a day (QID) | ORAL | Status: DC | PRN
  Administered 2019-03-12: 500 mg via ORAL

## 2019-03-12 MED ORDER — SUCCINYLCHOLINE CHLORIDE 200 MG/10ML IV SOSY
PREFILLED_SYRINGE | INTRAVENOUS | Status: DC | PRN
  Administered 2019-03-12: 120 mg via INTRAVENOUS

## 2019-03-12 MED ORDER — DOCUSATE SODIUM 100 MG PO CAPS
100.0000 mg | ORAL_CAPSULE | Freq: Two times a day (BID) | ORAL | Status: DC
  Administered 2019-03-13 – 2019-03-15 (×5): 100 mg via ORAL
  Filled 2019-03-12 (×5): qty 1

## 2019-03-12 MED ORDER — MORPHINE SULFATE (PF) 2 MG/ML IV SOLN: 0.5000 mg | INTRAVENOUS | Status: DC | PRN

## 2019-03-12 MED ORDER — METHOCARBAMOL 1000 MG/10ML IJ SOLN
500.0000 mg | Freq: Four times a day (QID) | INTRAVENOUS | Status: DC | PRN
  Filled 2019-03-12: qty 5

## 2019-03-12 MED ORDER — LIDOCAINE 2% (20 MG/ML) 5 ML SYRINGE
INTRAMUSCULAR | Status: AC
  Filled 2019-03-12: qty 15

## 2019-03-12 MED ORDER — ONDANSETRON HCL 4 MG/2ML IJ SOLN
INTRAMUSCULAR | Status: DC | PRN
  Administered 2019-03-12: 4 mg via INTRAVENOUS

## 2019-03-12 MED ORDER — TETANUS-DIPHTH-ACELL PERTUSSIS 5-2.5-18.5 LF-MCG/0.5 IM SUSP
0.5000 mL | Freq: Once | INTRAMUSCULAR | Status: AC
  Administered 2019-03-12: 0.5 mL via INTRAMUSCULAR
  Filled 2019-03-12: qty 0.5

## 2019-03-12 MED ORDER — SUCCINYLCHOLINE CHLORIDE 200 MG/10ML IV SOSY
PREFILLED_SYRINGE | INTRAVENOUS | Status: AC
  Filled 2019-03-12: qty 10

## 2019-03-12 MED ORDER — SODIUM CHLORIDE 0.9 % IV SOLN
3.0000 g | Freq: Four times a day (QID) | INTRAVENOUS | Status: DC
  Administered 2019-03-12 – 2019-03-15 (×10): 3 g via INTRAVENOUS
  Filled 2019-03-12 (×12): qty 3

## 2019-03-12 MED ORDER — ACETAMINOPHEN 325 MG PO TABS
325.0000 mg | ORAL_TABLET | Freq: Four times a day (QID) | ORAL | Status: DC | PRN
  Administered 2019-03-13: 325 mg via ORAL
  Filled 2019-03-12: qty 1

## 2019-03-12 MED ORDER — EPHEDRINE 5 MG/ML INJ
INTRAVENOUS | Status: AC
  Filled 2019-03-12: qty 20

## 2019-03-12 MED ORDER — VITAMIN C 500 MG PO TABS
1000.0000 mg | ORAL_TABLET | Freq: Every day | ORAL | Status: DC
  Administered 2019-03-13 – 2019-03-15 (×3): 1000 mg via ORAL
  Filled 2019-03-12 (×3): qty 2

## 2019-03-12 MED ORDER — HYDROCODONE-ACETAMINOPHEN 5-325 MG PO TABS
1.0000 | ORAL_TABLET | ORAL | Status: DC | PRN
  Administered 2019-03-12: 2 via ORAL

## 2019-03-12 MED ORDER — ACETAMINOPHEN 500 MG PO TABS
500.0000 mg | ORAL_TABLET | Freq: Four times a day (QID) | ORAL | Status: AC
  Administered 2019-03-12 – 2019-03-13 (×4): 500 mg via ORAL
  Filled 2019-03-12 (×4): qty 1

## 2019-03-12 MED ORDER — HYDROCODONE-ACETAMINOPHEN 5-325 MG PO TABS
ORAL_TABLET | ORAL | Status: AC
  Filled 2019-03-12: qty 2

## 2019-03-12 MED ORDER — PROPOFOL 10 MG/ML IV BOLUS
INTRAVENOUS | Status: DC | PRN
  Administered 2019-03-12: 150 mg via INTRAVENOUS

## 2019-03-12 MED ORDER — FAMOTIDINE 20 MG PO TABS
40.0000 mg | ORAL_TABLET | Freq: Every day | ORAL | Status: DC
  Administered 2019-03-13 – 2019-03-15 (×3): 40 mg via ORAL
  Filled 2019-03-12 (×3): qty 2

## 2019-03-12 SURGICAL SUPPLY — 45 items
BANDAGE ACE 3X5.8 VEL STRL LF (GAUZE/BANDAGES/DRESSINGS) ×2 IMPLANT
BANDAGE ACE 4X5 VEL STRL LF (GAUZE/BANDAGES/DRESSINGS) ×3 IMPLANT
BANDAGE ELASTIC 4 VELCRO ST LF (GAUZE/BANDAGES/DRESSINGS) ×2 IMPLANT
BNDG CONFORM 2 STRL LF (GAUZE/BANDAGES/DRESSINGS) IMPLANT
BNDG GAUZE ELAST 4 BULKY (GAUZE/BANDAGES/DRESSINGS) ×5 IMPLANT
CORDS BIPOLAR (ELECTRODE) ×3 IMPLANT
COVER SURGICAL LIGHT HANDLE (MISCELLANEOUS) ×3 IMPLANT
COVER WAND RF STERILE (DRAPES) ×3 IMPLANT
CUFF TOURNIQUET SINGLE 18IN (TOURNIQUET CUFF) ×3 IMPLANT
CUFF TOURNIQUET SINGLE 24IN (TOURNIQUET CUFF) IMPLANT
DRSG ADAPTIC 3X8 NADH LF (GAUZE/BANDAGES/DRESSINGS) ×3 IMPLANT
GAUZE SPONGE 4X4 12PLY STRL (GAUZE/BANDAGES/DRESSINGS) ×3 IMPLANT
GAUZE XEROFORM 1X8 LF (GAUZE/BANDAGES/DRESSINGS) ×3 IMPLANT
GAUZE XEROFORM 5X9 LF (GAUZE/BANDAGES/DRESSINGS) ×2 IMPLANT
GLOVE BIOGEL M 8.0 STRL (GLOVE) ×3 IMPLANT
GLOVE SS BIOGEL STRL SZ 8 (GLOVE) ×1 IMPLANT
GLOVE SUPERSENSE BIOGEL SZ 8 (GLOVE) ×2
GOWN STRL REUS W/ TWL LRG LVL3 (GOWN DISPOSABLE) ×1 IMPLANT
GOWN STRL REUS W/ TWL XL LVL3 (GOWN DISPOSABLE) ×2 IMPLANT
GOWN STRL REUS W/TWL LRG LVL3 (GOWN DISPOSABLE) ×3
GOWN STRL REUS W/TWL XL LVL3 (GOWN DISPOSABLE) ×6
KIT BASIN OR (CUSTOM PROCEDURE TRAY) ×3 IMPLANT
KIT TURNOVER KIT B (KITS) ×3 IMPLANT
LOOP VESSEL MAXI BLUE (MISCELLANEOUS) ×2 IMPLANT
MANIFOLD NEPTUNE II (INSTRUMENTS) ×3 IMPLANT
NDL HYPO 25GX1X1/2 BEV (NEEDLE) IMPLANT
NEEDLE HYPO 25GX1X1/2 BEV (NEEDLE) IMPLANT
NS IRRIG 1000ML POUR BTL (IV SOLUTION) ×3 IMPLANT
PACK ORTHO EXTREMITY (CUSTOM PROCEDURE TRAY) ×3 IMPLANT
PAD ARMBOARD 7.5X6 YLW CONV (MISCELLANEOUS) ×3 IMPLANT
PAD CAST 4YDX4 CTTN HI CHSV (CAST SUPPLIES) ×1 IMPLANT
PADDING CAST COTTON 4X4 STRL (CAST SUPPLIES) ×3
SCRUB BETADINE 4OZ XXX (MISCELLANEOUS) ×3 IMPLANT
SET CYSTO W/LG BORE CLAMP LF (SET/KITS/TRAYS/PACK) ×3 IMPLANT
SOL PREP POV-IOD 4OZ 10% (MISCELLANEOUS) ×3 IMPLANT
SPLINT FIBERGLASS 3X12 (CAST SUPPLIES) ×2 IMPLANT
SPONGE LAP 4X18 RFD (DISPOSABLE) ×3 IMPLANT
SWAB CULTURE ESWAB REG 1ML (MISCELLANEOUS) IMPLANT
SYR CONTROL 10ML LL (SYRINGE) IMPLANT
TOWEL OR 17X24 6PK STRL BLUE (TOWEL DISPOSABLE) ×3 IMPLANT
TOWEL OR 17X26 10 PK STRL BLUE (TOWEL DISPOSABLE) ×3 IMPLANT
TUBE CONNECTING 12'X1/4 (SUCTIONS) ×1
TUBE CONNECTING 12X1/4 (SUCTIONS) ×2 IMPLANT
WATER STERILE IRR 1000ML POUR (IV SOLUTION) ×3 IMPLANT
YANKAUER SUCT BULB TIP NO VENT (SUCTIONS) ×3 IMPLANT

## 2019-03-12 NOTE — Plan of Care (Signed)
  Problem: Education: Goal: Knowledge of General Education information will improve Description: Including pain rating scale, medication(s)/side effects and non-pharmacologic comfort measures Outcome: Progressing   Problem: Health Behavior/Discharge Planning: Goal: Ability to manage health-related needs will improve Outcome: Progressing   Problem: Clinical Measurements: Goal: Ability to maintain clinical measurements within normal limits will improve Outcome: Progressing Goal: Will remain free from infection Outcome: Progressing Goal: Diagnostic test results will improve Outcome: Progressing   Problem: Activity: Goal: Risk for activity intolerance will decrease Outcome: Progressing   Problem: Nutrition: Goal: Adequate nutrition will be maintained Outcome: Progressing   Problem: Coping: Goal: Level of anxiety will decrease Outcome: Progressing   Problem: Pain Managment: Goal: General experience of comfort will improve Outcome: Progressing   Problem: Safety: Goal: Ability to remain free from injury will improve Outcome: Progressing   

## 2019-03-12 NOTE — Anesthesia Postprocedure Evaluation (Signed)
Anesthesia Post Note  Patient: Theresa Pierce  Procedure(s) Performed: IRRIGATION AND DEBRIDEMENT EXTREMITY (Left Arm Lower)     Patient location during evaluation: PACU Anesthesia Type: General Level of consciousness: awake and alert, patient cooperative and oriented Pain management: pain level controlled Vital Signs Assessment: post-procedure vital signs reviewed and stable Respiratory status: spontaneous breathing, nonlabored ventilation and respiratory function stable Cardiovascular status: blood pressure returned to baseline and stable Postop Assessment: no apparent nausea or vomiting Anesthetic complications: no    Last Vitals:  Vitals:   03/12/19 2143 03/12/19 2159  BP: (!) 159/72 (!) 164/73  Pulse:  85  Resp:  16  Temp: 36.4 C 36.4 C  SpO2:  99%    Last Pain:  Vitals:   03/12/19 2159  TempSrc: Oral  PainSc:                  Davene Jobin,E. Devan Danzer

## 2019-03-12 NOTE — Op Note (Signed)
Operative note 03/12/2019  Roseanne Kaufman MD.  Preoperative diagnosis infected cat bite with extensor Tina synovitis  Postop diagnosis the same  Operative procedure irrigation debridement deep abscess left hand and index finger #2 extensor tenolysis tenosynovectomy left index finger and hand including the EDC and EIP tendons  Surgeon Roseanne Kaufman  Anesthesia General.  Cultures x2 taken  No complications.  Indications for the procedure patient is a 73 year old female bitten by cat 24 hours ago or greater with ascending cellulitis and an abscess.  She has infectious tenosynovitis about the extensor apparatus.  She is counseled in regards to risk and benefits surgery and desires to proceed.  Operative procedure patient was seen by myself and anesthesia taken to the operative theater and underwent a smooth induction of general anesthesia Hibiclens scrub followed by 10-minute surgical Betadine scrub were accomplished.  Elevation of the tourniquet was not needed.  We performed an incision over the 2 bite marks cultures were taken following this I deepen the dissection and performed extensor tenolysis tenosynovectomy of the EIP EDC and extensor apparatus about the left index finger and left hand this was an excisional debridement of devitalized tissue and Tina synovitic tissue.  Patient tolerated this well made a separate incision and made crisscrossing veins in a triangle fashion and then placed 3 L of saline through these areas.  She tolerated this well.  Thus deep abscess I&D x2 separate spots and a radical tenolysis tenosynovectomy of the extensor apparatus about the hand and finger left upper extremity including index finger was accomplished.  Patient tolerated this well.  Refill was excellent at the conclusion of the procedure and there were no complicating features.  Should be admitted for continued IV antibiotics.  She has been on Unasyn without difficulty.  There is a questionable  amoxicillin allergy but she is taken penicillin in the past and is doing quite well with the Unasyn.  We will continue this monitor her condition and I have discussed all issues with her family over the telephone tonight.  Bolden Hagerman MD

## 2019-03-12 NOTE — Anesthesia Preprocedure Evaluation (Addendum)
Anesthesia Evaluation  Patient identified by MRN, date of birth, ID band Patient awake    Reviewed: Allergy & Precautions, NPO status , Patient's Chart, lab work & pertinent test results  History of Anesthesia Complications Negative for: history of anesthetic complications  Airway Mallampati: I  TM Distance: >3 FB Neck ROM: Full    Dental  (+) Missing, Caps, Dental Advisory Given   Pulmonary  SARS coronavirus NEG   breath sounds clear to auscultation       Cardiovascular negative cardio ROS   Rhythm:Regular Rate:Normal     Neuro/Psych negative neurological ROS     GI/Hepatic Neg liver ROS, GERD  Medicated and Poorly Controlled,  Endo/Other  Morbid obesity  Renal/GU negative Renal ROS     Musculoskeletal   Abdominal (+) + obese,   Peds  Hematology negative hematology ROS (+)   Anesthesia Other Findings   Reproductive/Obstetrics                           Anesthesia Physical Anesthesia Plan  ASA: II and emergent  Anesthesia Plan: General   Post-op Pain Management:    Induction: Intravenous and Rapid sequence  PONV Risk Score and Plan: 4 or greater and Ondansetron, Dexamethasone and Treatment may vary due to age or medical condition  Airway Management Planned: Oral ETT  Additional Equipment:   Intra-op Plan:   Post-operative Plan: Extubation in OR  Informed Consent: I have reviewed the patients History and Physical, chart, labs and discussed the procedure including the risks, benefits and alternatives for the proposed anesthesia with the patient or authorized representative who has indicated his/her understanding and acceptance.     Dental advisory given  Plan Discussed with: CRNA and Surgeon  Anesthesia Plan Comments: (Plan routine monitors, GETA)       Anesthesia Quick Evaluation

## 2019-03-12 NOTE — Transfer of Care (Signed)
Immediate Anesthesia Transfer of Care Note  Patient: Theresa Pierce  Procedure(s) Performed: IRRIGATION AND DEBRIDEMENT EXTREMITY (Left Arm Lower)  Patient Location: PACU  Anesthesia Type:General  Level of Consciousness: awake, alert  and oriented  Airway & Oxygen Therapy: Patient Spontanous Breathing and Patient connected to nasal cannula oxygen  Post-op Assessment: Report given to RN and Post -op Vital signs reviewed and stable  Post vital signs: Reviewed and stable  Last Vitals:  Vitals Value Taken Time  BP    Temp    Pulse 87 03/12/2019  9:16 PM  Resp    SpO2 98 % 03/12/2019  9:16 PM  Vitals shown include unvalidated device data.  Last Pain:  Vitals:   03/12/19 1730  TempSrc:   PainSc: 3          Complications: No apparent anesthesia complications

## 2019-03-12 NOTE — ED Provider Notes (Signed)
[facility] EMERGENCY DEPARTMENT Provider Note   CSN: [id number] Arrival date & time: 03/12/19  1510    History   Chief Complaint Chief Complaint  Patient presents with  . Animal Bite    HPI Theresa Pierce is a 73 y.o. female.     Presents emergency department with left hand pain and swelling.  Reports that this progressively got worse after her cat bit her in the left hand around 9 PM last night.  Reports that the cat is up-to-date with all of his shots and vaccines.  Reports that she saw her primary care doctor who sent her here today.  Denies any fever, chills, drainage.     Past Medical History:  Diagnosis Date  . Broken ankle     Patient Active Problem List   Diagnosis Date Noted  . Cat bite of finger 03/12/2019  . Varicose veins of left lower extremity with complications 97/67/3419  . Ankle fracture 01/29/2013  . Lateral malleolar fracture 10/31/2012    Past Surgical History:  Procedure Laterality Date  . I&D EXTREMITY Left 03/12/2019   Procedure: IRRIGATION AND DEBRIDEMENT EXTREMITY;  Surgeon: Roseanne Kaufman, MD;  Location: [facility];  Service: Orthopedics;  Laterality: Left;  . laser ablations bilat GSV 2012 Bilateral 2012     OB History   No obstetric history on file.      Home Medications    Prior to Admission medications   Medication Sig Start Date End Date Taking? Authorizing Provider  acetaminophen (TYLENOL) 500 MG tablet Take 500 mg by mouth every 6 (six) hours as needed for mild pain.   Yes [provider]  Biotin 1 MG CAPS Take 1 capsule by mouth daily.   Yes [provider]  famotidine (PEPCID) 40 MG tablet Take 40 mg by mouth daily. 03/02/19  Yes [provider]  glucosamine-chondroitin 500-400 MG tablet Take 1 tablet by mouth 3 (three) times daily.   Yes [provider]  vitamin C (ASCORBIC ACID) 250 MG tablet Take 250 mg by mouth daily.   Yes [provider]    Family History  Family History  Problem Relation Age of Onset  . Heart disease Other   . Cancer Other   . Arthritis Other     Social History Social History   Tobacco Use  . Smoking status: Never Smoker  . Smokeless tobacco: Never Used  Substance Use Topics  . Alcohol use: No  . Drug use: No     Allergies   Amoxicillin and Codeine   Review of Systems Review of Systems  Constitutional: Negative for fever.  Respiratory: Negative for shortness of breath.   Cardiovascular: Negative for chest pain.  Gastrointestinal: Negative for nausea and vomiting.  Musculoskeletal: Positive for joint swelling.  Skin: Positive for color change and wound.  Allergic/Immunologic: Negative for immunocompromised state.  Neurological: Negative for dizziness and weakness.  Hematological: Negative for adenopathy. Does not bruise/bleed easily.     Physical Exam Updated Vital Signs BP (!) 147/56 (BP Location: Right Arm)   Pulse 73   Temp 98.7 F (37.1 C) (Oral)   Resp 16   Ht 5\' 6"  (1.676 m)   Wt 97.6 kg   SpO2 98%   BMI 34.73 kg/m   Physical Exam       ED Treatments / Results  Labs (all labs ordered are listed, but only abnormal results are displayed) Labs Reviewed  CBC WITH DIFFERENTIAL/PLATELET - Abnormal; Notable for the following components:  Result Value   Hemoglobin 15.1 (*)    All other components within normal limits  SARS CORONAVIRUS 2 (HOSPITAL ORDER, [facility])  AEROBIC/ANAEROBIC CULTURE (SURGICAL/DEEP WOUND)  BASIC METABOLIC PANEL  PROTIME-INR  TYPE AND SCREEN  ABO/RH    EKG None  Radiology Dg Hand Complete Left  Result Date: 03/12/2019 CLINICAL DATA:  Cat bite.  Left hand swelling and redness. EXAM: LEFT HAND - COMPLETE 3+ VIEW COMPARISON:  None. FINDINGS: Negative for fracture or dislocation. No evidence for a radiopaque foreign body. Joint space narrowing at the middle finger DIP joint. Mild degenerative changes at the first  carpometacarpal joint. Soft tissue calcifications near the thumb IP joint. Cannot exclude soft tissue swelling along the dorsum of the hand. IMPRESSION: No acute bone abnormality to the left hand. No evidence for a radiopaque foreign body. Electronically Signed   By: Markus Daft M.D.   On: 03/12/2019 16:55    Procedures Procedures (including critical care time)  Medications Ordered in ED Medications  famotidine (PEPCID) tablet 40 mg (40 mg Oral Given 03/13/19 0937)  vitamin C (ASCORBIC ACID) tablet 1,000 mg (1,000 mg Oral Given 03/13/19 0937)  ondansetron (ZOFRAN) tablet 4 mg (has no administration in time range)    Or  ondansetron (ZOFRAN) injection 4 mg (has no administration in time range)  promethazine (PHENERGAN) suppository 12.5 mg (has no administration in time range)  acetaminophen (TYLENOL) tablet 325-650 mg (has no administration in time range)  HYDROcodone-acetaminophen (NORCO/VICODIN) 5-325 MG per tablet 1-2 tablet (2 tablets Oral Given 03/12/19 2130)  HYDROcodone-acetaminophen (NORCO) 7.5-325 MG per tablet 1-2 tablet (has no administration in time range)  morphine 2 MG/ML injection 0.5-1 mg (has no administration in time range)  acetaminophen (TYLENOL) tablet 500 mg (500 mg Oral Given 03/13/19 1158)  methocarbamol (ROBAXIN) tablet 500 mg (500 mg Oral Given 03/12/19 2130)    Or  methocarbamol (ROBAXIN) 500 mg in dextrose 5 % 50 mL IVPB ( Intravenous See Alternative 03/12/19 2130)  docusate sodium (COLACE) capsule 100 mg (100 mg Oral Given 03/13/19 0937)  Ampicillin-Sulbactam (UNASYN) 3 g in sodium chloride 0.9 % 100 mL IVPB (3 g Intravenous New Bag/Given 03/13/19 1159)  Ampicillin-Sulbactam (UNASYN) 3 g in sodium chloride 0.9 % 100 mL IVPB (0 g Intravenous Stopped 03/12/19 1959)  Tdap (BOOSTRIX) injection 0.5 mL (0.5 mLs Intramuscular Given 03/12/19 1850)  methocarbamol (ROBAXIN) 500 MG tablet (  Duplicate 5/46/27 0350)  HYDROcodone-acetaminophen (NORCO/VICODIN) 5-325 MG per tablet (   Duplicate 0/93/81 8299)  fentaNYL (SUBLIMAZE) 100 MCG/2ML injection (  Duplicate 3/71/69 6789)     Initial Impression / Assessment and Plan / ED Course  I have reviewed the triage vital signs and the nursing notes.  Pertinent labs & imaging results that were available during my care of the patient were reviewed by me and considered in my medical decision making (see chart for details).  Clinical Course as of Mar 12 1434  Tue Mar 12, 2019  1803 Consulted with Hand who will come and see her and send to OR for washout. Patient stable, not septic. NPO, unasyn, covid test   [KM]    Clinical Course User Index [KM] Alveria Apley, PA-C        Final Clinical Impressions(s) / ED Diagnoses   Final diagnoses:  Cat bite, initial encounter  Cellulitis and abscess of hand    ED Discharge Orders    None       Alveria Apley, PA-C 03/13/19 1435  Margette Fast, MD 03/13/19 [phone]

## 2019-03-12 NOTE — H&P (Signed)
Theresa Pierce is an 73 y.o. female.   Chief Complaint: Patient presents with an infected cat bite with ascending cellulitis lymphangitis HPI: Infection left hand with cat bite wounds dorsally and red streaking/ascending cellulitis.  I reviewed this with the patient at length.  She denies other injury or problems.  I discussed with the patient all issues including an extensive discussion with our pharmacology staff the penicillin issues that she has had in the past.  She states that she has taken penicillin fine but queried whether amoxicillin bothered her lower extremities causing her legs to burn.  I reviewed this with her at length and the findings.    Past Medical History:  Diagnosis Date  . Broken ankle     Past Surgical History:  Procedure Laterality Date  . laser ablations bilat GSV 2012 Bilateral 2012    Family History  Problem Relation Age of Onset  . Heart disease Other   . Cancer Other   . Arthritis Other    Social History:  reports that she has never smoked. She has never used smokeless tobacco. She reports that she does not drink alcohol or use drugs.  Allergies:  Allergies  Allergen Reactions  . Amoxicillin Other (See Comments)    Did it involve swelling of the face/tongue/throat, SOB, or low BP? No Did it involve sudden or severe rash/hives, skin peeling, or any reaction on the inside of your mouth or nose? No Did you need to seek medical attention at a hospital or doctor's office? Yes When did it last happen?Her legs burned If all above answers are "NO", may proceed with cephalosporin use.   . Codeine Itching    (Not in a hospital admission)   Results for orders placed or performed during the hospital encounter of 03/12/19 (from the past 48 hour(s))  Basic metabolic panel     Status: None   Collection Time: 03/12/19  5:34 PM  Result Value Ref Range   Sodium 140 135 - 145 mmol/L   Potassium 3.9 3.5 - 5.1 mmol/L   Chloride 103 98 - 111 mmol/L   CO2 27 22 - 32 mmol/L   Glucose, Bld 96 70 - 99 mg/dL   BUN 9 8 - 23 mg/dL   Creatinine, Ser 0.76 0.44 - 1.00 mg/dL   Calcium 9.3 8.9 - 10.3 mg/dL   GFR calc non Af Amer >60 >60 mL/min   GFR calc Af Amer >60 >60 mL/min   Anion gap 10 5 - 15    Comment: Performed at [facility], [place] [address] [place], [place] [address]  Protime-INR     Status: None   Collection Time: 03/12/19  5:34 PM  Result Value Ref Range   Prothrombin Time 13.3 11.4 - 15.2 seconds   INR 1.0 0.8 - 1.2    Comment: (NOTE) INR goal varies based on device and disease states. Performed at [facility], [place] [address], [place], [place] [address]   Type and screen [place]     Status: None   Collection Time: 03/12/19  5:34 PM  Result Value Ref Range   ABO/RH(D) AB POS    Antibody Screen NEG    Sample Expiration      03/15/2019,2359 Performed at [facility], [place] [address] [place], [place] [address]   CBC with Differential     Status: Abnormal   Collection Time: 03/12/19  5:34 PM  Result Value Ref Range   WBC 9.8  4.0 - 10.5 K/uL   RBC 4.81 3.87 - 5.11 MIL/uL   Hemoglobin 15.1 (H) 12.0 - 15.0 g/dL   HCT 45.4 36.0 - 46.0 %   MCV 94.4 80.0 - 100.0 fL   MCH 31.4 26.0 - 34.0 pg   MCHC 33.3 30.0 - 36.0 g/dL   RDW 12.2 11.5 - 15.5 %   Platelets 197 150 - 400 K/uL   nRBC 0.0 0.0 - 0.2 %   Neutrophils Relative % 74 %   Neutro Abs 7.2 1.7 - 7.7 K/uL   Lymphocytes Relative 16 %   Lymphs Abs 1.6 0.7 - 4.0 K/uL   Monocytes Relative 9 %   Monocytes Absolute 0.8 0.1 - 1.0 K/uL   Eosinophils Relative 1 %   Eosinophils Absolute 0.1 0.0 - 0.5 K/uL   Basophils Relative 0 %   Basophils Absolute 0.0 0.0 - 0.1 K/uL   Immature Granulocytes 0 %   Abs Immature Granulocytes 0.04 0.00 - 0.07 K/uL    Comment: Performed at [facility] [address] [place], [place] [address]  ABO/Rh     Status: None (Preliminary result)   Collection Time: 03/12/19  5:34 PM  Result Value Ref  Range   ABO/RH(D)      AB POS Performed at [facility] [address], [place], [place] [address]    Dg Hand Complete Left  Result Date: 03/12/2019 CLINICAL DATA:  Cat bite.  Left hand swelling and redness. EXAM: LEFT HAND - COMPLETE 3+ VIEW COMPARISON:  None. FINDINGS: Negative for fracture or dislocation. No evidence for a radiopaque foreign body. Joint space narrowing at the middle finger DIP joint. Mild degenerative changes at the first carpometacarpal joint. Soft tissue calcifications near the thumb IP joint. Cannot exclude soft tissue swelling along the dorsum of the hand. IMPRESSION: No acute bone abnormality to the left hand. No evidence for a radiopaque foreign body. Electronically Signed   By: Markus Daft M.D.   On: 03/12/2019 16:55    Review of Systems  Cardiovascular: Negative.   Gastrointestinal: Negative.   Genitourinary: Negative.     Blood pressure (!) 185/88, pulse 88, temperature 98.2 F (36.8 C), temperature source Oral, resp. rate 14, SpO2 95 %. Physical Exam infection left hand dorsal aspect with significant swelling erythema/cellulitis and abnormality.  I reviewed this with her at length and the findings.  She has red streaking and 2 distinct wounds.  She is quite tender and has difficulty moving the finger.  I discussed with her all issues plans and concerns.  She denies other injury.  The patient is alert and oriented in no acute distress. The patient complains of pain in the affected upper extremity.  The patient is noted to have a normal HEENT exam. Lung fields show equal chest expansion and no shortness of breath. Abdomen exam is nontender without distention. Lower extremity examination does not show any fracture dislocation or blood clot symptoms. Pelvis is stable and the neck and back are stable and nontender. Assessment/Plan Infection left hand.  We will plan for irrigation debridement possible admission depending on the depth of the infection and  move forward accordingly with IV antibiotics and other measures.  I discussed all issues with patient.  We are planning surgery for your upper extremity. The risk and benefits of surgery to include risk of bleeding, infection, anesthesia,  damage to normal structures and failure of the surgery to accomplish its intended goals of relieving symptoms and restoring function have  been discussed in detail. With this in mind we plan to proceed. I have specifically discussed with the patient the pre-and postoperative regime and the dos and don'ts and risk and benefits in great detail. Risk and benefits of surgery also include risk of dystrophy(CRPS), chronic nerve pain, failure of the healing process to go onto completion and other inherent risks of surgery The relavent the pathophysiology of the disease/injury process, as well as the alternatives for treatment and postoperative course of action has been discussed in great detail with the patient who desires to proceed.  We will do everything in our power to help you (the patient) restore function to the upper extremity. It is a pleasure to see this patient today.   Willa Frater III, MD 03/12/2019, 7:11 PM

## 2019-03-12 NOTE — ED Provider Notes (Signed)
[facility] EMERGENCY DEPARTMENT Provider Note   CSN: [id number] Arrival date & time: 03/12/19  1510    History   Chief Complaint Chief Complaint  Patient presents with  . Animal Bite    HPI Theresa Pierce is a 73 y.o. female.     Patient is a 73 year old female with no past medical history presenting to the emergency department for cat bite to left hand.  Patient reports that she was petting her cat at home last night when it bit and scratched her.  Reports that her cat is up-to-date on all of its vaccines.  Reports that she noted immediately swelling and pain.  Swelling and pain became worse and there is no erythema and drainage from the bite sites.  Denies fever.     Past Medical History:  Diagnosis Date  . Broken ankle     Patient Active Problem List   Diagnosis Date Noted  . Varicose veins of left lower extremity with complications 45/62/5638  . Ankle fracture 01/29/2013  . Lateral malleolar fracture 10/31/2012    Past Surgical History:  Procedure Laterality Date  . laser ablations bilat GSV 2012 Bilateral 2012     OB History   No obstetric history on file.      Home Medications    Prior to Admission medications   Medication Sig Start Date End Date Taking? Authorizing Provider  acetaminophen (TYLENOL) 500 MG tablet Take 500 mg by mouth every 6 (six) hours as needed for mild pain.   Yes [provider]  Biotin 1 MG CAPS Take 1 capsule by mouth daily.   Yes [provider]  famotidine (PEPCID) 40 MG tablet Take 40 mg by mouth daily. 03/02/19  Yes [provider]  glucosamine-chondroitin 500-400 MG tablet Take 1 tablet by mouth 3 (three) times daily.   Yes [provider]  vitamin C (ASCORBIC ACID) 250 MG tablet Take 250 mg by mouth daily.   Yes [provider]    Family History Family History  Problem Relation Age of Onset  . Heart disease Other   . Cancer Other   . Arthritis Other      Social History Social History   Tobacco Use  . Smoking status: Never Smoker  . Smokeless tobacco: Never Used  Substance Use Topics  . Alcohol use: No  . Drug use: No     Allergies   Amoxicillin and Codeine   Review of Systems Review of Systems  Constitutional: Negative for chills, diaphoresis and fever.  Respiratory: Negative for shortness of breath.   Gastrointestinal: Negative for nausea and vomiting.  Musculoskeletal: Positive for arthralgias and joint swelling. Negative for back pain.  Skin: Positive for color change and wound. Negative for rash.  Allergic/Immunologic: Negative for immunocompromised state.  Hematological: Does not bruise/bleed easily.     Physical Exam Updated Vital Signs BP (!) 185/88   Pulse 88   Temp 98.2 F (36.8 C) (Oral)   Resp 14   SpO2 95%   Physical Exam       ED Treatments / Results  Labs (all labs ordered are listed, but only abnormal results are displayed) Labs Reviewed  CBC WITH DIFFERENTIAL/PLATELET - Abnormal; Notable for the following components:      Result Value   Hemoglobin 15.1 (*)    All other components within normal limits  SARS CORONAVIRUS 2 (HOSPITAL ORDER, [facility])  BASIC METABOLIC PANEL  PROTIME-INR  TYPE AND SCREEN  EKG None  Radiology Dg Hand Complete Left  Result Date: 03/12/2019 CLINICAL DATA:  Cat bite.  Left hand swelling and redness. EXAM: LEFT HAND - COMPLETE 3+ VIEW COMPARISON:  None. FINDINGS: Negative for fracture or dislocation. No evidence for a radiopaque foreign body. Joint space narrowing at the middle finger DIP joint. Mild degenerative changes at the first carpometacarpal joint. Soft tissue calcifications near the thumb IP joint. Cannot exclude soft tissue swelling along the dorsum of the hand. IMPRESSION: No acute bone abnormality to the left hand. No evidence for a radiopaque foreign body. Electronically Signed   By: Markus Daft M.D.   On: 03/12/2019  16:55    Procedures Procedures (including critical care time)  Medications Ordered in ED Medications  Ampicillin-Sulbactam (UNASYN) 3 g in sodium chloride 0.9 % 100 mL IVPB (has no administration in time range)  Tdap (BOOSTRIX) injection 0.5 mL (has no administration in time range)     Initial Impression / Assessment and Plan / ED Course  I have reviewed the triage vital signs and the nursing notes.  Pertinent labs & imaging results that were available during my care of the patient were reviewed by me and considered in my medical decision making (see chart for details).  Clinical Course as of Mar 12 1827  Tue Mar 12, 2019  1803 Consulted with Hand who will come and see her and send to OR for washout. Patient stable, not septic. NPO, unasyn, covid test   [KM]    Clinical Course User Index [KM] Alveria Apley, PA-C         Final Clinical Impressions(s) / ED Diagnoses   Final diagnoses:  None    ED Discharge Orders    None       Kristine Royal 03/12/19 Renaee Munda, MD 03/13/19 1950

## 2019-03-12 NOTE — ED Notes (Addendum)
Pt placed in only gown/hospital socks and valuables removed and placed with security. Pt to be transported to bay 36. Bed control notified of need for bed request.

## 2019-03-12 NOTE — Anesthesia Procedure Notes (Signed)
Procedure Name: Intubation Date/Time: 03/12/2019 8:30 PM Performed by: Babs Bertin, CRNA Pre-anesthesia Checklist: Patient identified, Emergency Drugs available, Suction available and Patient being monitored Patient Re-evaluated:Patient Re-evaluated prior to induction Oxygen Delivery Method: Circle System Utilized Preoxygenation: Pre-oxygenation with 100% oxygen Induction Type: IV induction and Rapid sequence Laryngoscope Size: Mac and 3 Grade View: Grade I Tube type: Oral Tube size: 7.0 mm Number of attempts: 1 Airway Equipment and Method: Stylet and Oral airway Placement Confirmation: ETT inserted through vocal cords under direct vision,  positive ETCO2 and breath sounds checked- equal and bilateral Secured at: 21 cm Tube secured with: Tape Dental Injury: Teeth and Oropharynx as per pre-operative assessment

## 2019-03-12 NOTE — ED Triage Notes (Signed)
Patient sent from MD office for further evaluation of left hand cat bite. Redness and pain with mild swelling, minimal drainage from puncture

## 2019-03-12 NOTE — ED Notes (Signed)
ED Provider at bedside. 

## 2019-03-13 ENCOUNTER — Encounter (HOSPITAL_COMMUNITY): Payer: Self-pay | Admitting: Orthopedic Surgery

## 2019-03-13 DIAGNOSIS — Z8261 Family history of arthritis: Secondary | ICD-10-CM | POA: Diagnosis not present

## 2019-03-13 DIAGNOSIS — Z8249 Family history of ischemic heart disease and other diseases of the circulatory system: Secondary | ICD-10-CM | POA: Diagnosis not present

## 2019-03-13 DIAGNOSIS — Z23 Encounter for immunization: Secondary | ICD-10-CM | POA: Diagnosis not present

## 2019-03-13 DIAGNOSIS — L03114 Cellulitis of left upper limb: Secondary | ICD-10-CM | POA: Diagnosis not present

## 2019-03-13 DIAGNOSIS — Z6834 Body mass index (BMI) 34.0-34.9, adult: Secondary | ICD-10-CM | POA: Diagnosis not present

## 2019-03-13 DIAGNOSIS — S61452A Open bite of left hand, initial encounter: Secondary | ICD-10-CM | POA: Diagnosis not present

## 2019-03-13 DIAGNOSIS — Z880 Allergy status to penicillin: Secondary | ICD-10-CM | POA: Diagnosis not present

## 2019-03-13 DIAGNOSIS — E669 Obesity, unspecified: Secondary | ICD-10-CM | POA: Diagnosis not present

## 2019-03-13 DIAGNOSIS — L02512 Cutaneous abscess of left hand: Secondary | ICD-10-CM | POA: Diagnosis not present

## 2019-03-13 DIAGNOSIS — Z885 Allergy status to narcotic agent status: Secondary | ICD-10-CM | POA: Diagnosis not present

## 2019-03-13 DIAGNOSIS — Z1159 Encounter for screening for other viral diseases: Secondary | ICD-10-CM | POA: Diagnosis not present

## 2019-03-13 DIAGNOSIS — Z79899 Other long term (current) drug therapy: Secondary | ICD-10-CM | POA: Diagnosis not present

## 2019-03-13 DIAGNOSIS — W5501XA Bitten by cat, initial encounter: Secondary | ICD-10-CM | POA: Diagnosis not present

## 2019-03-13 NOTE — Progress Notes (Signed)
Patient ID: Theresa Pierce, female   DOB: 1945-11-22, 73 y.o.   MRN: [id number] Patient stable and doing very well she has excellent range of motion much improvement.  We will plan for dressing change tomorrow morning and continue IV Unasyn.  Her right antecubital fossa IV has some difficulty and I discussed this with nursing staff for possible change.  Continue IV Unasyn which she has tolerated beautifully.  She will notify me should any problems occur and I will see her back in the office once discharged if she looks well tomorrow.  Tomorrow we will pull her drains and move forward accordingly.  All questions have been addressed.  Cultures planned.  Alma Muegge MD

## 2019-03-13 NOTE — TOC Progression Note (Signed)
Transition of Care [facility]) - Progression Note    Patient Details  Name: Theresa Pierce MRN: [id number] Date of Birth: 04-15-1946  Transition of Care [facility]) CM/SW Contact  Jacalyn Lefevre Edson Snowball, RN Phone Number: 03/13/2019, 10:15 AM  Clinical Narrative:    Patient from home with husband.  Confirmed face sheet information.   I and D and IV antibiotics    Expected Discharge Plan: Home/Self Care Barriers to Discharge: Continued Medical Work up  Expected Discharge Plan and Services Expected Discharge Plan: Home/Self Care       Living arrangements for the past 2 months: Single Family Home                   DME Agency: NA       HH Arranged: NA           Social Determinants of Health (SDOH) Interventions    Readmission Risk Interventions No flowsheet data found.

## 2019-03-14 NOTE — Progress Notes (Signed)
Patient ID: Theresa Pierce, female   DOB: February 14, 1946, 73 y.o.   MRN: [id number]  Stable  No problems with ABX IV changed  VSS  Patient  carefully had the bandage removed without difficulty. Following this we performed a  irrigation and debridement.  Procedure note: Patient underwent thorough prep and sterile field application. Following this with combination knife blade scissor tip and curette we performed irrigation and debridement of skin and subcutaneous tissue. Deep tissue was also irrigated and removed. Patient tolerated this well. Copious  amounts of saline were placed in the wound. There is approximately a liter of irrigant applied to the wound. Once again this was an excisional debridement of skin subcutaneous tissue as well as associated deep tissue about the wound.  Following this the patient was packed with wet-to-dry gauze. We'll continue bedside irrigation and debridement and dressing changes in an aggressive fashion.  Patient tolerated the irrigation and debridement procedure without difficulty.  The patient understands the necessity of strict wound care antibiotics and other measures. Oftentimes wounds will take days to declare themselves. We'll do everything in our power to try and ensure uneventful healing.      Given the residual cellulitis albiet improved we will plann for 24 hours of IV ABX  Hopefully dc tomorrow  We have discussed with the patient the issues regarding their infection to the extremity. We will continue antibiotics and await culture results. Often times it will take 3-5 days for cultures to become final. During this time we will typically have the patient on intravenous antibiotics until we can find a parenteral route of antibiotic regime specific for the bacteria or organism isolated. We have discussed with the patient the need for daily irrigation and debridement as well as therapy to the area. We have discussed with the patient the necessity of range of  motion to the involved joints as discussed today. We have discussed with the patient the unpredictability of infections at times. We'll continue to work towards good pain control and restoration of function. The patient understands the need for meticulous wound care and the necessity of proper followup.  The possible complications of stiffness (loss of motion), resistant infection, possible deep bone infection, possible chronic pain issues, possible need for multiple surgeries and even amputation.  With this in mind the patient understands our goal is to eradicate the infection to quiesence. We will continue to work towards these goals.   Yvonne Petite MDS

## 2019-03-15 MED ORDER — CLINDAMYCIN HCL 150 MG PO CAPS: 150.0000 mg | ORAL_CAPSULE | Freq: Four times a day (QID) | ORAL | 0 refills | Status: AC

## 2019-03-15 MED ORDER — ONDANSETRON HCL 4 MG PO TABS: 4.0000 mg | ORAL_TABLET | Freq: Every day | ORAL | 1 refills | Status: AC | PRN

## 2019-03-15 MED ORDER — CIPROFLOXACIN HCL 500 MG PO TABS: 500.0000 mg | ORAL_TABLET | Freq: Two times a day (BID) | ORAL | 0 refills | Status: AC

## 2019-03-15 MED ORDER — HYDROCODONE-ACETAMINOPHEN 5-325 MG PO TABS: 1.0000 | ORAL_TABLET | Freq: Four times a day (QID) | ORAL | 0 refills | Status: DC | PRN

## 2019-03-15 NOTE — Discharge Instructions (Signed)
Please make sure to elevate your hand over the weekend.  Please begin your dressing changes tomorrow as instructed by Dr. Amedeo Plenty.  Please call us for any problems.  It is okay to gently move the fingers.  Please take a probiotic and have a cup of yogurt each day as the antibiotics can be difficult on your GI system.  Keep bandage clean and dry.  Call for any problems.  No smoking.  Criteria for driving a car: you should be off your pain medicine for 7-8 hours, able to drive one handed(confident), thinking clearly and feeling able in your judgement to drive. Continue elevation as it will decrease swelling.  If instructed by MD move your fingers within the confines of the bandage/splint.  Use ice if instructed by your MD. Call immediately for any sudden loss of feeling in your hand/arm or change in functional abilities of the extremity.  We recommend that you to take vitamin C 1000 mg a day to promote healing. We also recommend that if you require  pain medicine that you take a stool softener to prevent constipation as most pain medicines will have constipation side effects. We recommend either Peri-Colace or Senokot and recommend that you also consider adding MiraLAX as well to prevent the constipation affects from pain medicine if you are required to use them. These medicines are over the counter and may be purchased at a local pharmacy. A cup of yogurt and a probiotic can also be helpful during the recovery process as the medicines can disrupt your intestinal environment.

## 2019-03-15 NOTE — Progress Notes (Signed)
Patient ID: Theresa Pierce, female   DOB: 1945/11/21, 73 y.o.   MRN: [id number] Patient seen and examined at bedside.  I wrote carefully remove the bandage.  Following this we performed a additional debridement.  I removed the packing and performed irrigation and debridement of skin subtenons tissue.  Patient  carefully had the bandage removed without difficulty. Following this we performed a  irrigation and debridement.  Procedure note: Patient underwent thorough prep and sterile field application. Following this with combination knife blade scissor tip and curette we performed irrigation and debridement of skin and subcutaneous tissue. Deep tissue was also irrigated and removed. Patient tolerated this well. Copious  amounts of saline were placed in the wound. There is approximately a liter of irrigant applied to the wound. Once again this was an excisional debridement of skin subcutaneous tissue as well as associated deep tissue about the wound.  Following this the patient was packed with wet-to-dry gauze. We'll continue bedside irrigation and debridement and dressing changes in an aggressive fashion.  Patient tolerated the irrigation and debridement procedure without difficulty.  The patient understands the necessity of strict wound care antibiotics and other measures. Oftentimes wounds will take days to declare themselves. We'll do everything in our power to try and ensure uneventful healing.   I instructed the patient on proper dressing changes and other issues.  I do feel that she is stable and safe to go home.  She has had multiple irrigation and debridements and has had appropriate antibiotic therapy.  She is resolving the infection nicely.  We will make plans to discharge her home today.  She tolerated this well.  I will see her back in the office Wednesday.  Her right lower extremity is doing quite well but still has a little bit of irritation at the distal edge.  There is no  advancing cellulitis.  She does have some venous stasis noted in my opinion.  Kohan Azizi MD

## 2019-03-15 NOTE — Progress Notes (Signed)
Patient discharged to home with instructions, all valuables returned to the patient.

## 2019-03-15 NOTE — Discharge Summary (Addendum)
Physician Discharge Summary  Patient ID: Theresa Pierce MRN: [id number] DOB/AGE: 1946-09-01 73 y.o.  Admit date: 03/12/2019 Discharge date:   Admission Diagnoses: animal bite to finger Past Medical History:  Diagnosis Date  . Broken ankle     Discharge Diagnoses:  Active Problems:   Cat bite of finger   Surgeries: Procedure(s): IRRIGATION AND DEBRIDEMENT EXTREMITY on 03/12/2019    Consultants:   Discharged Condition: Improved  Hospital Course: CYRENE GHARIBIAN is an 73 y.o. female who was admitted 03/12/2019 with a chief complaint of  Chief Complaint  Patient presents with  . Animal Bite  , and found to have a diagnosis of animal bite to finger.  They were brought to the operating room on 03/12/2019 and underwent Procedure(s): IRRIGATION AND DEBRIDEMENT EXTREMITY.    They were given perioperative antibiotics:  Anti-infectives (From admission, onward)   Start     Dose/Rate Route Frequency Ordered Stop   03/15/19 0000  ciprofloxacin (CIPRO) 500 MG tablet     500 mg Oral 2 times daily 03/15/19 0813 03/30/19 2359   03/15/19 0000  clindamycin (CLEOCIN) 150 MG capsule     150 mg Oral 4 times daily 03/15/19 0813 03/25/19 2359   03/12/19 2215  Ampicillin-Sulbactam (UNASYN) 3 g in sodium chloride 0.9 % 100 mL IVPB     3 g 200 mL/hr over 30 Minutes Intravenous Every 6 hours 03/12/19 2207     03/12/19 1815  Ampicillin-Sulbactam (UNASYN) 3 g in sodium chloride 0.9 % 100 mL IVPB     3 g 200 mL/hr over 30 Minutes Intravenous  Once 03/12/19 1813 03/12/19 1959    .  They were given sequential compression devices, early ambulation, and Other (comment) for DVT prophylaxis.  Recent vital signs:  Patient Vitals for the past 24 hrs:  BP Temp Temp src Pulse Resp SpO2  03/15/19 0448 (!) 164/63 97.8 F (36.6 C) Oral 68 - 99 %  03/14/19 2046 (!) 160/76 97.7 F (36.5 C) Oral 91 17 97 %  03/14/19 1342 (!) 167/60 98.1 F (36.7 C) Oral 75 18 100 %  .  Recent laboratory studies: No  results found.  Discharge Medications:   Allergies as of 03/15/2019      Reactions   Amoxicillin Other (See Comments)   Did it involve swelling of the face/tongue/throat, SOB, or low BP? No Did it involve sudden or severe rash/hives, skin peeling, or any reaction on the inside of your mouth or nose? No Did you need to seek medical attention at a hospital or doctor's office? Yes When did it last happen?Her legs burned If all above answers are "NO", may proceed with cephalosporin use.   Codeine Itching      Medication List    TAKE these medications   acetaminophen 500 MG tablet Commonly known as:  TYLENOL Take 500 mg by mouth every 6 (six) hours as needed for mild pain.   Biotin 1 MG Caps Take 1 capsule by mouth daily.   ciprofloxacin 500 MG tablet Commonly known as:  Cipro Take 1 tablet (500 mg total) by mouth 2 (two) times daily for 15 days.   clindamycin 150 MG capsule Commonly known as:  Cleocin Take 1 capsule (150 mg total) by mouth 4 (four) times daily for 10 days.   famotidine 40 MG tablet Commonly known as:  PEPCID Take 40 mg by mouth daily.   glucosamine-chondroitin 500-400 MG tablet Take 1 tablet by mouth 3 (three) times daily.   HYDROcodone-acetaminophen 5-325 MG  tablet Commonly known as:  Norco Take 1 tablet by mouth every 6 (six) hours as needed for moderate pain.   ondansetron 4 MG tablet Commonly known as:  Zofran Take 1 tablet (4 mg total) by mouth daily as needed for nausea or vomiting.   vitamin C 250 MG tablet Commonly known as:  ASCORBIC ACID Take 250 mg by mouth daily.       Diagnostic Studies: Dg Hand Complete Left  Result Date: 03/12/2019 CLINICAL DATA:  Cat bite.  Left hand swelling and redness. EXAM: LEFT HAND - COMPLETE 3+ VIEW COMPARISON:  None. FINDINGS: Negative for fracture or dislocation. No evidence for a radiopaque foreign body. Joint space narrowing at the middle finger DIP joint. Mild degenerative changes at the first  carpometacarpal joint. Soft tissue calcifications near the thumb IP joint. Cannot exclude soft tissue swelling along the dorsum of the hand. IMPRESSION: No acute bone abnormality to the left hand. No evidence for a radiopaque foreign body. Electronically Signed   By: Markus Daft M.D.   On: 03/12/2019 16:55    They benefited maximally from their hospital stay and there were no complications.     Disposition: Discharge disposition: 01-Home or Self Care      Discharge Instructions    Call MD / Call 911   Complete by:  As directed    If you experience chest pain or shortness of breath, CALL 911 and be transported to the hospital emergency room.  If you develope a fever above 101 F, pus (white drainage) or increased drainage or redness at the wound, or calf pain, call your surgeon's office.   Constipation Prevention   Complete by:  As directed    Drink plenty of fluids.  Prune juice may be helpful.  You may use a stool softener, such as Colace (over the counter) 100 mg twice a day.  Use MiraLax (over the counter) for constipation as needed.   Diet - low sodium heart healthy   Complete by:  As directed    Increase activity slowly as tolerated   Complete by:  As directed      Follow-up Information    Roseanne Kaufman, MD Follow up in 5 day(s).   Specialty:  Orthopedic Surgery Why:  Please come to Dr. Vanetta Shawl office at 12 noon Wednesday Contact information: [address] [place] [address] [phone]          Patient tolerated her hospitalization well she had much improvement.  Please see progress notes.  Discharge medicines ciprofloxacin and clindamycin due to the amoxicillin issues in the past.  RTC Wednesday my office.  Elevate move massage fingers and dressing changes beginning tomorrow as instructed by myself extensively at bedside today.  She is undergone multiple irrigation and debridements as well as Unasyn administration which she tolerated  well.    Signed: Satira Anis Sulma Ruffino III 03/15/2019, 8:15 AM

## 2019-03-17 LAB — AEROBIC/ANAEROBIC CULTURE W GRAM STAIN (SURGICAL/DEEP WOUND)

## 2019-04-09 DIAGNOSIS — R197 Diarrhea, unspecified: Secondary | ICD-10-CM | POA: Diagnosis not present

## 2019-05-01 ENCOUNTER — Other Ambulatory Visit (HOSPITAL_COMMUNITY): Payer: Self-pay | Admitting: Internal Medicine

## 2019-05-01 DIAGNOSIS — Z1231 Encounter for screening mammogram for malignant neoplasm of breast: Secondary | ICD-10-CM

## 2019-05-24 ENCOUNTER — Other Ambulatory Visit: Payer: Self-pay

## 2019-06-03 ENCOUNTER — Other Ambulatory Visit: Payer: Self-pay

## 2019-06-03 DIAGNOSIS — Z20822 Contact with and (suspected) exposure to covid-19: Secondary | ICD-10-CM

## 2019-06-03 DIAGNOSIS — R6889 Other general symptoms and signs: Secondary | ICD-10-CM | POA: Diagnosis not present

## 2019-06-04 LAB — NOVEL CORONAVIRUS, NAA: SARS-CoV-2, NAA: NOT DETECTED

## 2019-08-20 DIAGNOSIS — G43109 Migraine with aura, not intractable, without status migrainosus: Secondary | ICD-10-CM | POA: Diagnosis not present

## 2019-08-20 DIAGNOSIS — H43813 Vitreous degeneration, bilateral: Secondary | ICD-10-CM | POA: Diagnosis not present

## 2019-08-20 DIAGNOSIS — Z961 Presence of intraocular lens: Secondary | ICD-10-CM | POA: Diagnosis not present

## 2019-08-20 DIAGNOSIS — D3132 Benign neoplasm of left choroid: Secondary | ICD-10-CM | POA: Diagnosis not present

## 2019-08-20 DIAGNOSIS — H04123 Dry eye syndrome of bilateral lacrimal glands: Secondary | ICD-10-CM | POA: Diagnosis not present

## 2019-09-09 DIAGNOSIS — E6609 Other obesity due to excess calories: Secondary | ICD-10-CM | POA: Diagnosis not present

## 2019-09-09 DIAGNOSIS — Z23 Encounter for immunization: Secondary | ICD-10-CM | POA: Diagnosis not present

## 2019-09-09 DIAGNOSIS — Z6833 Body mass index (BMI) 33.0-33.9, adult: Secondary | ICD-10-CM | POA: Diagnosis not present

## 2019-09-09 DIAGNOSIS — Z0001 Encounter for general adult medical examination with abnormal findings: Secondary | ICD-10-CM | POA: Diagnosis not present

## 2019-09-09 DIAGNOSIS — Z1389 Encounter for screening for other disorder: Secondary | ICD-10-CM | POA: Diagnosis not present

## 2019-10-11 IMAGING — DX DG KNEE COMPLETE 4+V*R*
4 series · 4 of 4 positions shown · non-contrast
Comparison: None.

CLINICAL DATA: Twisting injury yesterday with knee pain, initial
encounter

EXAM:
RIGHT KNEE - COMPLETE 4+ VIEW

[knee ap]
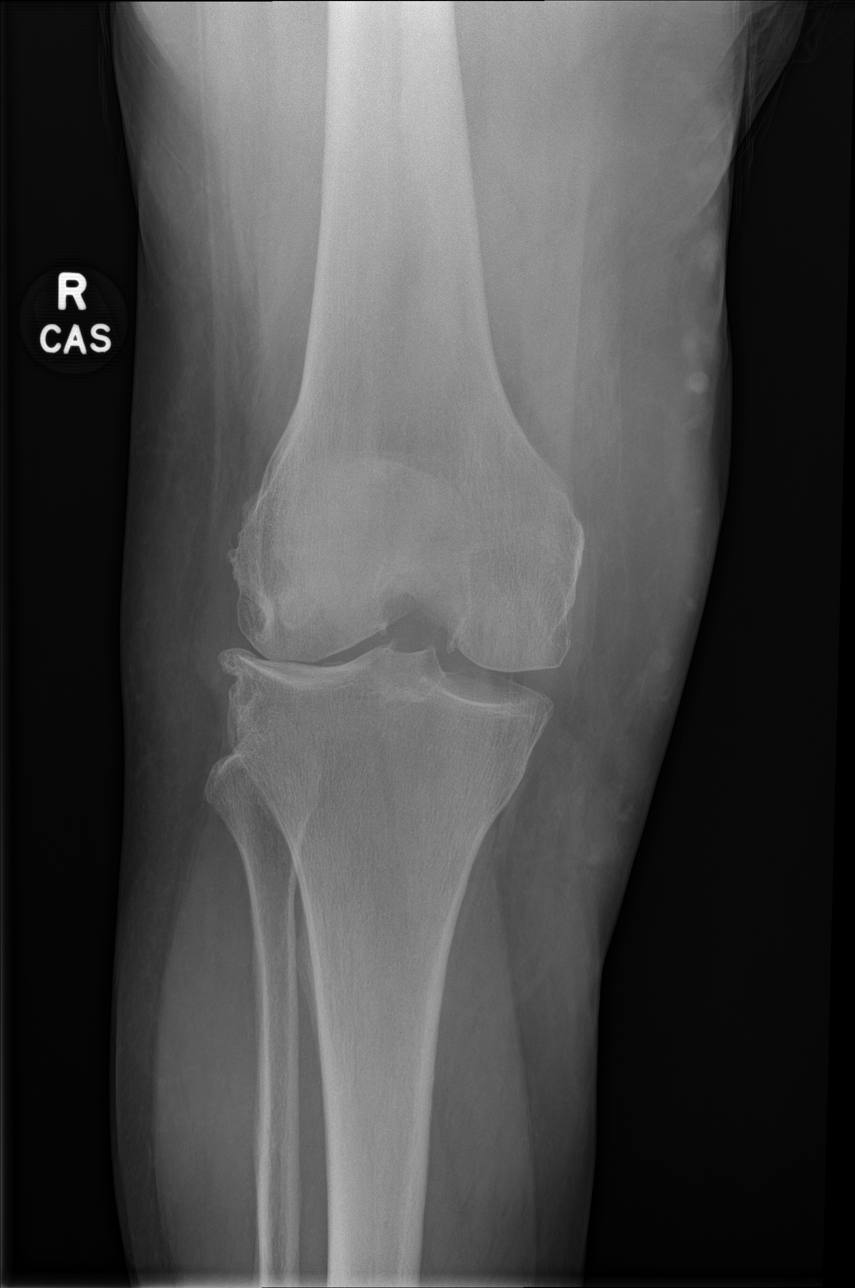

[knee obl (1 of 2)]
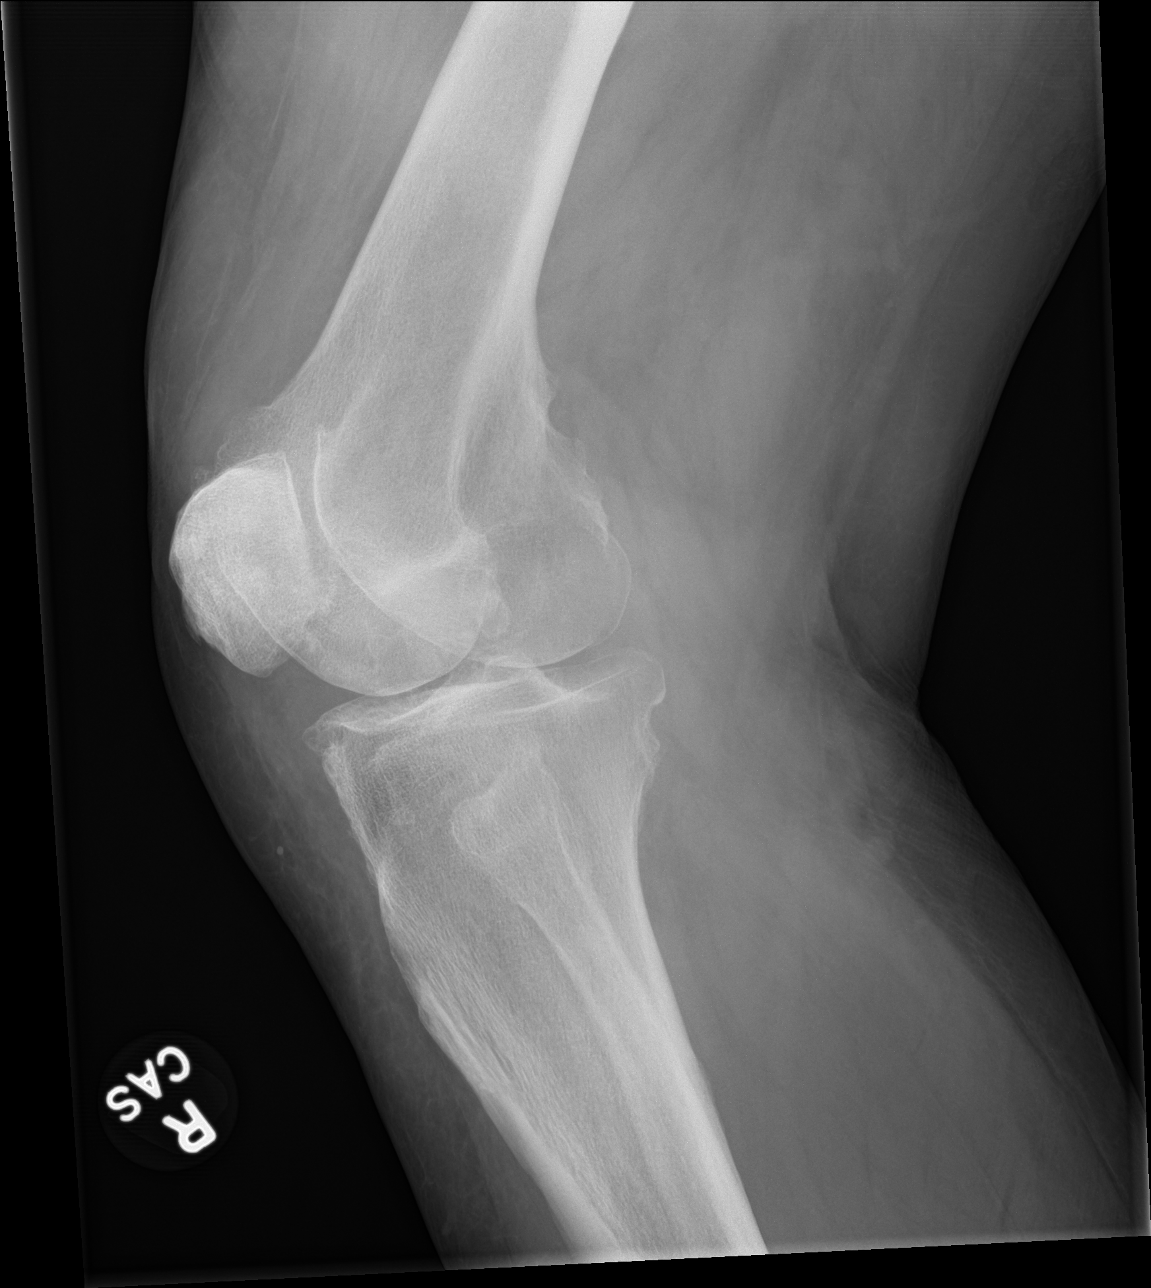

[knee obl (2 of 2)]
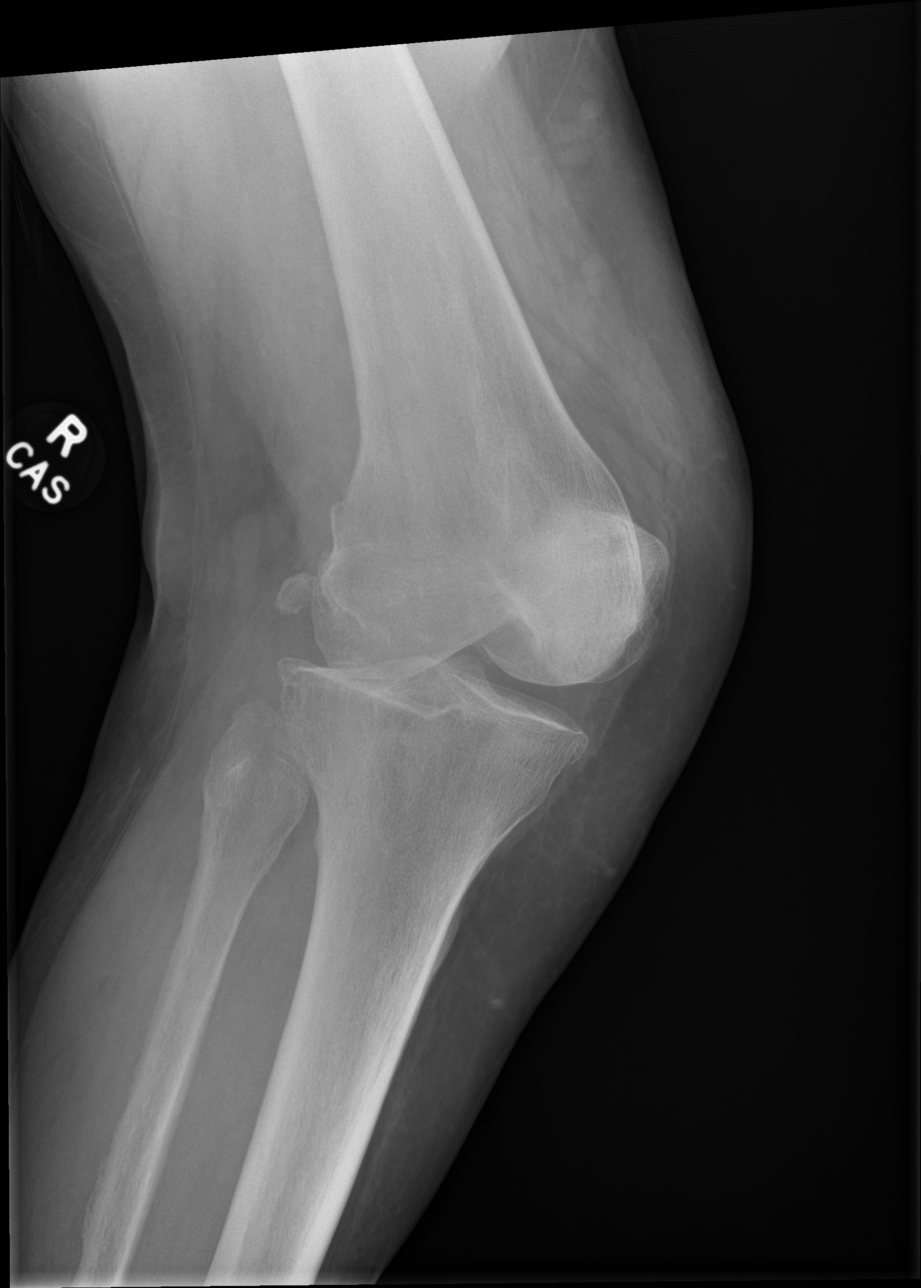

[knee lat]
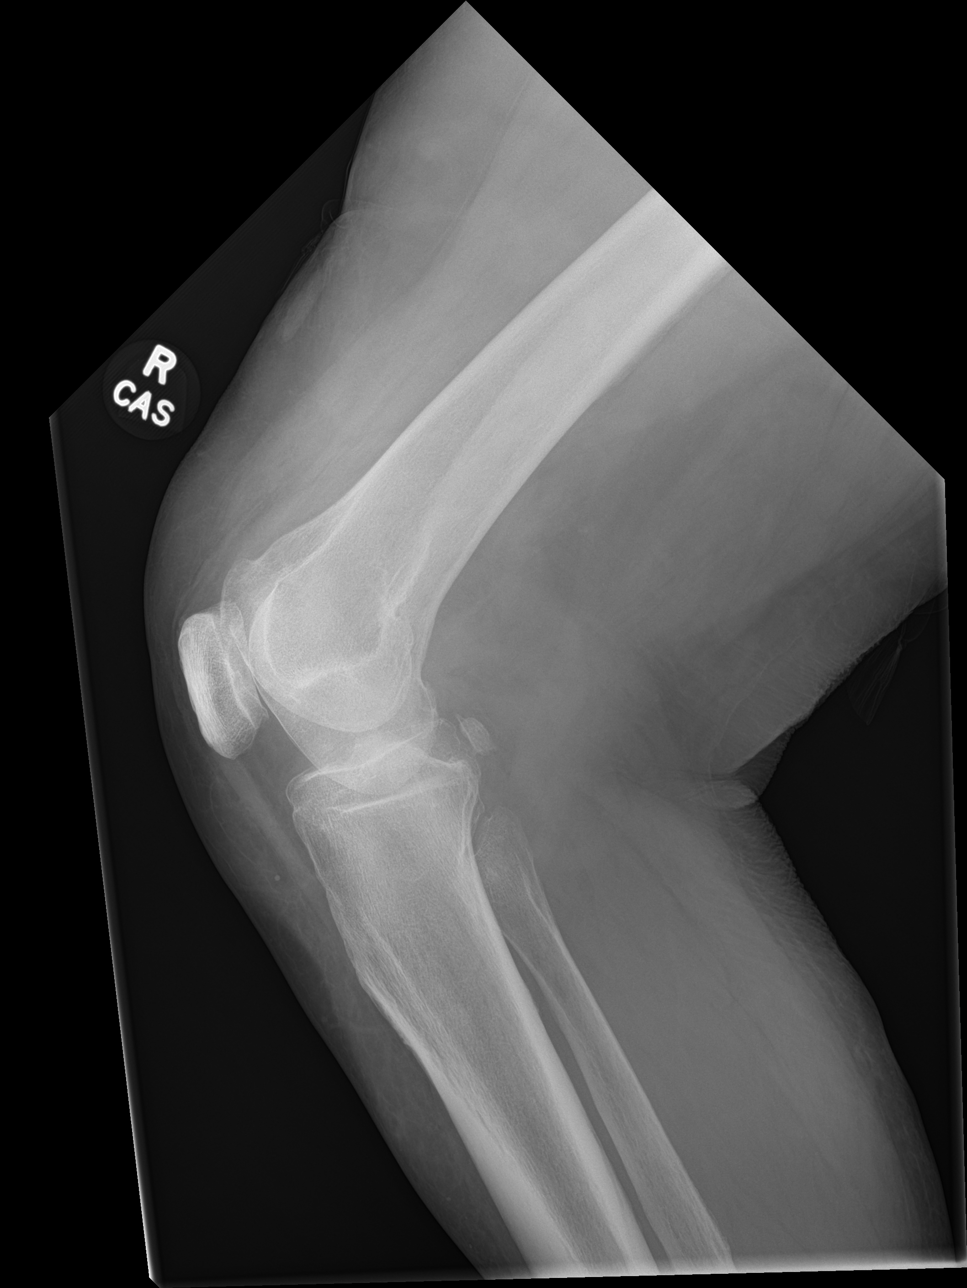

[4 of 4 positions shown; findings below may reference images not displayed]

FINDINGS: Tricompartmental degenerative changes are noted most marked in the
lateral joint space. No joint effusion is seen. No acute fracture or
dislocation is noted.
IMPRESSION: Degenerative change without acute abnormality.

## 2019-12-06 DIAGNOSIS — Z0001 Encounter for general adult medical examination with abnormal findings: Secondary | ICD-10-CM | POA: Diagnosis not present

## 2019-12-06 DIAGNOSIS — Z1389 Encounter for screening for other disorder: Secondary | ICD-10-CM | POA: Diagnosis not present

## 2020-02-07 DIAGNOSIS — M79675 Pain in left toe(s): Secondary | ICD-10-CM | POA: Diagnosis not present

## 2020-02-07 DIAGNOSIS — L600 Ingrowing nail: Secondary | ICD-10-CM | POA: Diagnosis not present

## 2020-02-21 DIAGNOSIS — L600 Ingrowing nail: Secondary | ICD-10-CM | POA: Diagnosis not present

## 2020-07-21 DIAGNOSIS — M79675 Pain in left toe(s): Secondary | ICD-10-CM | POA: Diagnosis not present

## 2020-07-21 DIAGNOSIS — L600 Ingrowing nail: Secondary | ICD-10-CM | POA: Diagnosis not present

## 2020-08-04 DIAGNOSIS — L600 Ingrowing nail: Secondary | ICD-10-CM | POA: Diagnosis not present

## 2020-08-10 DIAGNOSIS — Z23 Encounter for immunization: Secondary | ICD-10-CM | POA: Diagnosis not present

## 2020-08-24 DIAGNOSIS — H5202 Hypermetropia, left eye: Secondary | ICD-10-CM | POA: Diagnosis not present

## 2020-08-24 DIAGNOSIS — D3132 Benign neoplasm of left choroid: Secondary | ICD-10-CM | POA: Diagnosis not present

## 2020-08-24 DIAGNOSIS — Z961 Presence of intraocular lens: Secondary | ICD-10-CM | POA: Diagnosis not present

## 2020-08-24 DIAGNOSIS — H04123 Dry eye syndrome of bilateral lacrimal glands: Secondary | ICD-10-CM | POA: Diagnosis not present

## 2020-08-24 DIAGNOSIS — H26491 Other secondary cataract, right eye: Secondary | ICD-10-CM | POA: Diagnosis not present

## 2020-08-24 DIAGNOSIS — H5211 Myopia, right eye: Secondary | ICD-10-CM | POA: Diagnosis not present

## 2020-08-24 DIAGNOSIS — H524 Presbyopia: Secondary | ICD-10-CM | POA: Diagnosis not present

## 2020-08-25 DIAGNOSIS — L600 Ingrowing nail: Secondary | ICD-10-CM | POA: Diagnosis not present

## 2020-09-16 ENCOUNTER — Other Ambulatory Visit (HOSPITAL_COMMUNITY): Payer: Self-pay | Admitting: Internal Medicine

## 2020-09-16 DIAGNOSIS — Z1231 Encounter for screening mammogram for malignant neoplasm of breast: Secondary | ICD-10-CM

## 2020-10-02 ENCOUNTER — Other Ambulatory Visit: Payer: Self-pay

## 2020-10-02 ENCOUNTER — Ambulatory Visit (HOSPITAL_COMMUNITY)
Admission: RE | Admit: 2020-10-02 | Discharge: 2020-10-02 | Disposition: A | Discharge status: Home / Self Care | Payer: Medicare Other | Source: Ambulatory Visit | Attending: Internal Medicine | Admitting: Internal Medicine

## 2020-10-02 DIAGNOSIS — Z1231 Encounter for screening mammogram for malignant neoplasm of breast: Secondary | ICD-10-CM | POA: Insufficient documentation

## 2020-10-06 DIAGNOSIS — Z6834 Body mass index (BMI) 34.0-34.9, adult: Secondary | ICD-10-CM | POA: Diagnosis not present

## 2020-10-06 DIAGNOSIS — Z23 Encounter for immunization: Secondary | ICD-10-CM | POA: Diagnosis not present

## 2020-10-06 DIAGNOSIS — Z1331 Encounter for screening for depression: Secondary | ICD-10-CM | POA: Diagnosis not present

## 2020-10-06 DIAGNOSIS — M1611 Unilateral primary osteoarthritis, right hip: Secondary | ICD-10-CM | POA: Diagnosis not present

## 2020-10-06 DIAGNOSIS — Z0000 Encounter for general adult medical examination without abnormal findings: Secondary | ICD-10-CM | POA: Diagnosis not present

## 2021-05-21 DIAGNOSIS — Z23 Encounter for immunization: Secondary | ICD-10-CM | POA: Diagnosis not present

## 2021-08-18 ENCOUNTER — Emergency Department (HOSPITAL_COMMUNITY): Payer: Medicare Other

## 2021-08-18 ENCOUNTER — Emergency Department (HOSPITAL_COMMUNITY)
Admission: EM | Admit: 2021-08-18 | Discharge: 2021-08-18 | Disposition: A | Discharge status: Home / Self Care | Payer: Medicare Other | Attending: Emergency Medicine | Admitting: Emergency Medicine

## 2021-08-18 ENCOUNTER — Encounter (HOSPITAL_COMMUNITY): Payer: Self-pay

## 2021-08-18 DIAGNOSIS — R519 Headache, unspecified: Secondary | ICD-10-CM | POA: Diagnosis not present

## 2021-08-18 DIAGNOSIS — R110 Nausea: Secondary | ICD-10-CM | POA: Diagnosis not present

## 2021-08-18 DIAGNOSIS — J309 Allergic rhinitis, unspecified: Secondary | ICD-10-CM | POA: Diagnosis not present

## 2021-08-18 DIAGNOSIS — R0989 Other specified symptoms and signs involving the circulatory and respiratory systems: Secondary | ICD-10-CM | POA: Diagnosis not present

## 2021-08-18 DIAGNOSIS — D1779 Benign lipomatous neoplasm of other sites: Secondary | ICD-10-CM | POA: Diagnosis not present

## 2021-08-18 DIAGNOSIS — I672 Cerebral atherosclerosis: Secondary | ICD-10-CM | POA: Diagnosis not present

## 2021-08-18 DIAGNOSIS — J22 Unspecified acute lower respiratory infection: Secondary | ICD-10-CM | POA: Diagnosis not present

## 2021-08-18 DIAGNOSIS — D170 Benign lipomatous neoplasm of skin and subcutaneous tissue of head, face and neck: Secondary | ICD-10-CM | POA: Diagnosis not present

## 2021-08-18 LAB — URINALYSIS, ROUTINE W REFLEX MICROSCOPIC
Bilirubin Urine: NEGATIVE
Glucose, UA: NEGATIVE mg/dL
Hgb urine dipstick: NEGATIVE
Ketones, ur: 20 mg/dL — AB
Leukocytes,Ua: NEGATIVE
Nitrite: NEGATIVE
Protein, ur: NEGATIVE mg/dL
Specific Gravity, Urine: 1.003 — ABNORMAL LOW (ref 1.005–1.030)
pH: 6 (ref 5.0–8.0)

## 2021-08-18 LAB — COMPREHENSIVE METABOLIC PANEL
ALT: 20 U/L (ref 0–44)
AST: 24 U/L (ref 15–41)
Albumin: 4.3 g/dL (ref 3.5–5.0)
Alkaline Phosphatase: 78 U/L (ref 38–126)
Anion gap: 11 (ref 5–15)
BUN: 12 mg/dL (ref 8–23)
CO2: 23 mmol/L (ref 22–32)
Calcium: 9.7 mg/dL (ref 8.9–10.3)
Chloride: 98 mmol/L (ref 98–111)
Creatinine, Ser: 0.63 mg/dL (ref 0.44–1.00)
GFR, Estimated: >60 mL/min (ref >60)
Glucose, Bld: 131 mg/dL — ABNORMAL HIGH (ref 70–99)
Potassium: 3.4 mmol/L — ABNORMAL LOW (ref 3.5–5.1)
Sodium: 132 mmol/L — ABNORMAL LOW (ref 135–145)
Total Bilirubin: 1 mg/dL (ref 0.3–1.2)
Total Protein: 8 g/dL (ref 6.5–8.1)

## 2021-08-18 LAB — CBC
HCT: 43.9 % (ref 36.0–46.0)
Hemoglobin: 15.2 g/dL — ABNORMAL HIGH (ref 12.0–15.0)
MCH: 32.7 pg (ref 26.0–34.0)
MCHC: 34.6 g/dL (ref 30.0–36.0)
MCV: 94.4 fL (ref 80.0–100.0)
Platelets: 190 10*3/uL (ref 150–400)
RBC: 4.65 MIL/uL (ref 3.87–5.11)
RDW: 11.9 % (ref 11.5–15.5)
WBC: 5.6 10*3/uL (ref 4.0–10.5)
nRBC: 0 % (ref 0.0–0.2)

## 2021-08-18 LAB — LIPASE, BLOOD: Lipase: 32 U/L (ref 11–51)

## 2021-08-18 MED ORDER — METOCLOPRAMIDE HCL 5 MG/ML IJ SOLN
10.0000 mg | Freq: Once | INTRAMUSCULAR | Status: AC
  Administered 2021-08-18: 10 mg via INTRAVENOUS
  Filled 2021-08-18: qty 2

## 2021-08-18 MED ORDER — KETOROLAC TROMETHAMINE 30 MG/ML IJ SOLN
30.0000 mg | Freq: Once | INTRAMUSCULAR | Status: AC
  Administered 2021-08-18: 30 mg via INTRAVENOUS
  Filled 2021-08-18: qty 1

## 2021-08-18 MED ORDER — SODIUM CHLORIDE 0.9 % IV BOLUS
1000.0000 mL | Freq: Once | INTRAVENOUS | Status: AC
  Administered 2021-08-18: 1000 mL via INTRAVENOUS

## 2021-08-18 MED ORDER — ONDANSETRON 4 MG PO TBDP: ORAL_TABLET | ORAL | 0 refills | Status: DC

## 2021-08-18 MED ORDER — OXYCODONE-ACETAMINOPHEN 5-325 MG PO TABS: 1.0000 | ORAL_TABLET | Freq: Four times a day (QID) | ORAL | 0 refills | Status: DC | PRN

## 2021-08-18 MED ORDER — ONDANSETRON HCL 4 MG/2ML IJ SOLN
4.0000 mg | Freq: Once | INTRAMUSCULAR | Status: AC
  Administered 2021-08-18: 4 mg via INTRAVENOUS
  Filled 2021-08-18: qty 2

## 2021-08-18 MED ORDER — DIPHENHYDRAMINE HCL 50 MG/ML IJ SOLN
25.0000 mg | Freq: Once | INTRAMUSCULAR | Status: AC
  Administered 2021-08-18: 25 mg via INTRAVENOUS
  Filled 2021-08-18: qty 1

## 2021-08-18 NOTE — ED Provider Notes (Signed)
[facility] EMERGENCY DEPARTMENT Provider Note   CSN: [id number] Arrival date & time: 08/18/21  1506     History Chief Complaint  Patient presents with   Nausea    Theresa Pierce is a 75 y.o. female.  Patient has a history of bad headaches.  She complains of nausea and headache.  No vomiting no fevers no chills no neck pain  The history is provided by the patient and medical records.  Headache Pain location:  Generalized Quality:  Dull Radiates to:  Does not radiate Severity currently:  7/10 Severity at highest:  8/10 Onset quality:  Sudden Timing:  Constant Progression:  Worsening Chronicity:  New Similar to prior headaches: no   Context: not activity   Associated symptoms: no abdominal pain, no back pain, no congestion, no cough, no diarrhea, no fatigue, no seizures and no sinus pressure       Past Medical History:  Diagnosis Date   Broken ankle     Patient Active Problem List   Diagnosis Date Noted   Cat bite of finger 03/12/2019   Varicose veins of left lower extremity with complications 96/29/5284   Ankle fracture 01/29/2013   Lateral malleolar fracture 10/31/2012    Past Surgical History:  Procedure Laterality Date   I & D EXTREMITY Left 03/12/2019   Procedure: IRRIGATION AND DEBRIDEMENT EXTREMITY;  Surgeon: Roseanne Kaufman, MD;  Location: [place];  Service: Orthopedics;  Laterality: Left;   laser ablations bilat GSV 2012 Bilateral 2012     OB History   No obstetric history on file.     Family History  Problem Relation Age of Onset   Heart disease Other    Cancer Other    Arthritis Other     Social History   Tobacco Use   Smoking status: Never   Smokeless tobacco: Never  Substance Use Topics   Alcohol use: No   Drug use: No    Home Medications Prior to Admission medications   Medication Sig Start Date End Date Taking? Authorizing Provider  ondansetron (ZOFRAN ODT) 4 MG disintegrating tablet 4mg  ODT q4 hours prn nausea/vomit  08/18/21  Yes Milton Ferguson, MD  oxyCODONE-acetaminophen (PERCOCET) 5-325 MG tablet Take 1 tablet by mouth every 6 (six) hours as needed for moderate pain. 08/18/21  Yes Milton Ferguson, MD  acetaminophen (TYLENOL) 500 MG tablet Take 500 mg by mouth every 6 (six) hours as needed for mild pain.    [provider]  Biotin 1 MG CAPS Take 1 capsule by mouth daily.    [provider]  famotidine (PEPCID) 40 MG tablet Take 40 mg by mouth daily. 03/02/19   [provider]  glucosamine-chondroitin 500-400 MG tablet Take 1 tablet by mouth 3 (three) times daily.    [provider]  HYDROcodone-acetaminophen (NORCO) 5-325 MG tablet Take 1 tablet by mouth every 6 (six) hours as needed for moderate pain. 03/15/19   Roseanne Kaufman, MD  vitamin C (ASCORBIC ACID) 250 MG tablet Take 250 mg by mouth daily.    [provider]    Allergies    Amoxicillin and Codeine  Review of Systems   Review of Systems  Constitutional:  Negative for appetite change and fatigue.  HENT:  Negative for congestion, ear discharge and sinus pressure.   Eyes:  Negative for discharge.  Respiratory:  Negative for cough.   Cardiovascular:  Negative for chest pain.  Gastrointestinal:  Negative for abdominal pain and diarrhea.  Genitourinary:  Negative for frequency and  hematuria.  Musculoskeletal:  Negative for back pain.  Skin:  Negative for rash.  Neurological:  Positive for headaches. Negative for seizures.  Psychiatric/Behavioral:  Negative for hallucinations.    Physical Exam Updated Vital Signs BP (!) 188/89   Pulse 95   Temp 99.7 F (37.6 C) (Temporal)   Resp 16   Ht 5\' 6"  (1.676 m)   Wt 97.5 kg   SpO2 96%   BMI 34.70 kg/m   Physical Exam Vitals and nursing note reviewed.  Constitutional:      Appearance: She is well-developed.  HENT:     Head: Normocephalic.     Nose: Nose normal.  Eyes:     General: No scleral icterus.    Conjunctiva/sclera: Conjunctivae normal.   Neck:     Thyroid: No thyromegaly.  Cardiovascular:     Rate and Rhythm: Normal rate and regular rhythm.     Heart sounds: No murmur heard.   No friction rub. No gallop.  Pulmonary:     Breath sounds: No stridor. No wheezing or rales.  Chest:     Chest wall: No tenderness.  Abdominal:     General: There is no distension.     Tenderness: There is no abdominal tenderness. There is no rebound.  Musculoskeletal:        General: Normal range of motion.     Cervical back: Neck supple.  Lymphadenopathy:     Cervical: No cervical adenopathy.  Skin:    Findings: No erythema or rash.  Neurological:     Mental Status: She is alert and oriented to person, place, and time.     Motor: No abnormal muscle tone.     Coordination: Coordination normal.  Psychiatric:        Behavior: Behavior normal.    ED Results / Procedures / Treatments   Labs (all labs ordered are listed, but only abnormal results are displayed) Labs Reviewed  COMPREHENSIVE METABOLIC PANEL - Abnormal; Notable for the following components:      Result Value   Sodium 132 (*)    Potassium 3.4 (*)    Glucose, Bld 131 (*)    All other components within normal limits  CBC - Abnormal; Notable for the following components:   Hemoglobin 15.2 (*)    All other components within normal limits  LIPASE, BLOOD  URINALYSIS, ROUTINE W REFLEX MICROSCOPIC    EKG None  Radiology CT Head Wo Contrast  Result Date: 08/18/2021 CLINICAL DATA:  Cerebral hemorrhage suspected EXAM: CT HEAD WITHOUT CONTRAST TECHNIQUE: Contiguous axial images were obtained from the base of the skull through the vertex without intravenous contrast. COMPARISON:  2014 FINDINGS: Brain: There is no acute intracranial hemorrhage, mass effect, or edema. Gray-white differentiation is preserved. There is no extra-axial fluid collection. Ventricles and sulci are within normal limits in size and configuration. Patchy low-density in the supratentorial white matter is  nonspecific but may reflect chronic microvascular ischemic changes Vascular: There is mild atherosclerotic calcification at the skull base. Skull: Calvarium is unremarkable. Sinuses/Orbits: No acute finding. Other: Right frontal scalp lipoma.  Mastoid air cells are clear. IMPRESSION: No acute intracranial abnormality. Electronically Signed   By: Macy Mis M.D.   On: 08/18/2021 17:10   DG Chest Portable 1 View  Result Date: 08/18/2021 CLINICAL DATA:  Congestion. EXAM: PORTABLE CHEST 1 VIEW COMPARISON:  None. FINDINGS: No focal consolidation, pleural effusion or pneumothorax. The cardiac silhouette is within limits. No acute osseous pathology. IMPRESSION: No active disease. Electronically Signed  By: Anner Crete M.D.   On: 08/18/2021 21:08    Procedures Procedures   Medications Ordered in ED Medications  sodium chloride 0.9 % bolus 1,000 mL (0 mLs Intravenous Stopped 08/18/21 2051)  ondansetron (ZOFRAN) injection 4 mg (4 mg Intravenous Given 08/18/21 1928)  ketorolac (TORADOL) 30 MG/ML injection 30 mg (30 mg Intravenous Given 08/18/21 2042)  metoCLOPramide (REGLAN) injection 10 mg (10 mg Intravenous Given 08/18/21 2038)  diphenhydrAMINE (BENADRYL) injection 25 mg (25 mg Intravenous Given 08/18/21 2041)    ED Course  I have reviewed the triage vital signs and the nursing notes.  Pertinent labs & imaging results that were available during my care of the patient were reviewed by me and considered in my medical decision making (see chart for details).    MDM Rules/Calculators/A&P                           Patient with a severe headache that improved with migraine cocktail.  Labs and CT scan negative she will follow-up with her PCP Final Clinical Impression(s) / ED Diagnoses Final diagnoses:  Nausea    Rx / DC Orders ED Discharge Orders          Ordered    ondansetron (ZOFRAN ODT) 4 MG disintegrating tablet        08/18/21 2121    oxyCODONE-acetaminophen (PERCOCET) 5-325  MG tablet  Every 6 hours PRN        08/18/21 2121             Milton Ferguson, MD 08/21/21 1226

## 2021-08-18 NOTE — ED Provider Notes (Signed)
MSE note.  Patient complains of a headache nausea and persistent dry heaves.  She was given a shot of steroids today for sinus infection.  With regards a.m. patient alert in mild distress lungs clear heart regular rate and rhythm.  Patient has had labs and x-rays ordered   Milton Ferguson, MD 08/18/21 1625

## 2021-08-18 NOTE — Discharge Instructions (Signed)
Follow-up with your doctor next week for recheck 

## 2021-08-18 NOTE — ED Triage Notes (Addendum)
Pt. Arrived via POV with complaints of sinus issues. Pt. States they are feeling dehydrated and having dry heaves since yesterday. Pt. Denies actually vomiting. Pt. States their head hurts.  Pt. Denies being around anyone with covid. Pt. Was seen by their PCP this morning and was given a prescription of azithromycin. Pt. Has prescription on hand but has not taken the prescription. Pt. Was also given a steroid shot this morning at this PCP.

## 2021-08-30 DIAGNOSIS — H26491 Other secondary cataract, right eye: Secondary | ICD-10-CM | POA: Diagnosis not present

## 2021-08-30 DIAGNOSIS — D3132 Benign neoplasm of left choroid: Secondary | ICD-10-CM | POA: Diagnosis not present

## 2021-08-30 DIAGNOSIS — H04123 Dry eye syndrome of bilateral lacrimal glands: Secondary | ICD-10-CM | POA: Diagnosis not present

## 2021-08-30 DIAGNOSIS — Z961 Presence of intraocular lens: Secondary | ICD-10-CM | POA: Diagnosis not present

## 2021-09-18 DIAGNOSIS — Z20822 Contact with and (suspected) exposure to covid-19: Secondary | ICD-10-CM | POA: Diagnosis not present

## 2021-09-24 DIAGNOSIS — Z23 Encounter for immunization: Secondary | ICD-10-CM | POA: Diagnosis not present

## 2021-10-08 DIAGNOSIS — Z23 Encounter for immunization: Secondary | ICD-10-CM | POA: Diagnosis not present

## 2022-03-02 DIAGNOSIS — M25561 Pain in right knee: Secondary | ICD-10-CM | POA: Diagnosis not present

## 2022-03-02 DIAGNOSIS — M25562 Pain in left knee: Secondary | ICD-10-CM | POA: Diagnosis not present

## 2022-03-02 DIAGNOSIS — M25551 Pain in right hip: Secondary | ICD-10-CM | POA: Diagnosis not present

## 2022-04-07 DIAGNOSIS — M1611 Unilateral primary osteoarthritis, right hip: Secondary | ICD-10-CM | POA: Diagnosis not present

## 2022-04-07 DIAGNOSIS — M170 Bilateral primary osteoarthritis of knee: Secondary | ICD-10-CM | POA: Diagnosis not present

## 2022-05-05 DIAGNOSIS — M2042 Other hammer toe(s) (acquired), left foot: Secondary | ICD-10-CM | POA: Diagnosis not present

## 2022-05-05 DIAGNOSIS — L97529 Non-pressure chronic ulcer of other part of left foot with unspecified severity: Secondary | ICD-10-CM | POA: Diagnosis not present

## 2022-05-17 DIAGNOSIS — L97529 Non-pressure chronic ulcer of other part of left foot with unspecified severity: Secondary | ICD-10-CM | POA: Diagnosis not present

## 2022-05-17 DIAGNOSIS — M205X2 Other deformities of toe(s) (acquired), left foot: Secondary | ICD-10-CM | POA: Diagnosis not present

## 2022-05-20 ENCOUNTER — Emergency Department (HOSPITAL_COMMUNITY)
Admission: EM | Admit: 2022-05-20 | Discharge: 2022-05-21 | Disposition: A | Discharge status: Home / Self Care | Payer: Medicare Other | Attending: Emergency Medicine | Admitting: Emergency Medicine

## 2022-05-20 ENCOUNTER — Other Ambulatory Visit: Payer: Self-pay

## 2022-05-20 ENCOUNTER — Encounter (HOSPITAL_COMMUNITY): Payer: Self-pay | Admitting: *Deleted

## 2022-05-20 DIAGNOSIS — W5501XA Bitten by cat, initial encounter: Secondary | ICD-10-CM | POA: Insufficient documentation

## 2022-05-20 DIAGNOSIS — S61452A Open bite of left hand, initial encounter: Secondary | ICD-10-CM | POA: Insufficient documentation

## 2022-05-20 NOTE — ED Notes (Signed)
Unit secretary to call animal control

## 2022-05-20 NOTE — ED Triage Notes (Signed)
Pt with scratch and bite to left hand, occurred at 2200 tonight. Pt is the owner of the cat and states cat is up to date on it's shots.  Last tetanus shot back in 2020, when the cat bit pt before.

## 2022-05-21 DIAGNOSIS — S61452A Open bite of left hand, initial encounter: Secondary | ICD-10-CM | POA: Diagnosis not present

## 2022-05-21 MED ORDER — DOXYCYCLINE HYCLATE 100 MG PO TABS
100.0000 mg | ORAL_TABLET | Freq: Once | ORAL | Status: AC
  Administered 2022-05-21: 100 mg via ORAL
  Filled 2022-05-21: qty 1

## 2022-05-21 MED ORDER — METRONIDAZOLE 500 MG PO TABS: 500.0000 mg | ORAL_TABLET | Freq: Three times a day (TID) | ORAL | 0 refills | Status: DC

## 2022-05-21 MED ORDER — DOXYCYCLINE HYCLATE 100 MG PO CAPS: 100.0000 mg | ORAL_CAPSULE | Freq: Two times a day (BID) | ORAL | 0 refills | Status: DC

## 2022-05-21 MED ORDER — METRONIDAZOLE 500 MG/100ML IV SOLN
500.0000 mg | Freq: Once | INTRAVENOUS | Status: AC
  Administered 2022-05-21: 500 mg via INTRAVENOUS
  Filled 2022-05-21: qty 100

## 2022-05-21 NOTE — Discharge Instructions (Signed)
Begin taking metronidazole and doxycycline as prescribed.  Return to the emergency department if you develop redness, streaking up the arm, pus draining from the wound, increased pain, fevers, or for other new and concerning symptoms.

## 2022-05-21 NOTE — ED Provider Notes (Signed)
[facility] EMERGENCY DEPARTMENT Provider Note   CSN: [id number] Arrival date & time: 05/20/22  2238     History  Chief Complaint  Patient presents with   Animal Bite    Theresa Pierce is a 76 y.o. female.  Patient is a 76 year old female presenting with a cat bite to the left hand.  The bite came from her own cat that was apparently startled.  Cat's shots are up-to-date.  Patient's last tetanus was 2 years ago when the same cat bit her previously.  She required debridement and washout by hand surgery when this occurred before.  The history is provided by the patient.  Animal Bite Contact animal:  Cat Location:  Hand Hand injury location:  L hand Pain details:    Quality:  Aching   Severity:  Moderate   Timing:  Constant      Home Medications Prior to Admission medications   Medication Sig Start Date End Date Taking? Authorizing Provider  acetaminophen (TYLENOL) 500 MG tablet Take 500 mg by mouth every 6 (six) hours as needed for mild pain.    [provider]  Biotin 1 MG CAPS Take 1 capsule by mouth daily.    [provider]  famotidine (PEPCID) 40 MG tablet Take 40 mg by mouth daily. 03/02/19   [provider]  glucosamine-chondroitin 500-400 MG tablet Take 1 tablet by mouth 3 (three) times daily.    [provider]  HYDROcodone-acetaminophen (NORCO) 5-325 MG tablet Take 1 tablet by mouth every 6 (six) hours as needed for moderate pain. 03/15/19   Roseanne Kaufman, MD  ondansetron (ZOFRAN ODT) 4 MG disintegrating tablet '4mg'$  ODT q4 hours prn nausea/vomit 08/18/21   Milton Ferguson, MD  oxyCODONE-acetaminophen (PERCOCET) 5-325 MG tablet Take 1 tablet by mouth every 6 (six) hours as needed for moderate pain. 08/18/21   Milton Ferguson, MD  vitamin C (ASCORBIC ACID) 250 MG tablet Take 250 mg by mouth daily.    [provider]      Allergies    Amoxicillin and Codeine    Review of Systems   Review of Systems  All other systems  reviewed and are negative.   Physical Exam Updated Vital Signs BP (!) 191/92 (BP Location: Right Arm)   Pulse (!) 109   Temp 97.7 F (36.5 C) (Oral)   Resp (!) 22   Ht '5\' 6"'$  (1.676 m)   Wt 93 kg   SpO2 100%   BMI 33.09 kg/m  Physical Exam Vitals and nursing note reviewed.  Constitutional:      General: She is not in acute distress.    Appearance: Normal appearance.  HENT:     Head: Normocephalic and atraumatic.  Pulmonary:     Effort: Pulmonary effort is normal.  Musculoskeletal:     Comments: There are several puncture wounds to the dorsum of the left hand along with 2 superficial lacerations across the dorsal aspect of the proximal third and fourth fingers.  There is no tendon involvement.  Bleeding is controlled.  Skin:    General: Skin is warm and dry.  Neurological:     Mental Status: She is alert and oriented to person, place, and time.     ED Results / Procedures / Treatments   Labs (all labs ordered are listed, but only abnormal results are displayed) Labs Reviewed - No data to display  EKG None  Radiology No results found.  Procedures Procedures    Medications Ordered in ED Medications  metroNIDAZOLE (FLAGYL) IVPB 500 mg (has no administration in time range)  doxycycline (VIBRA-TABS) tablet 100 mg (has no administration in time range)    ED Course/ Medical Decision Making/ A&P  Patient presenting for evaluation of cat bite to the hand.  She has history of being bitten by the same cat and requiring washout by hand surgery.  Patient has penicillin allergy, so IV Flagyl and p.o. doxycycline were given.  Patient to be discharged with Flagyl and doxycycline and return as needed.  Final Clinical Impression(s) / ED Diagnoses Final diagnoses:  None    Rx / DC Orders ED Discharge Orders     None         Veryl Speak, MD 05/21/22 0210

## 2022-05-23 DIAGNOSIS — M79642 Pain in left hand: Secondary | ICD-10-CM | POA: Diagnosis not present

## 2022-05-27 DIAGNOSIS — M79642 Pain in left hand: Secondary | ICD-10-CM | POA: Diagnosis not present

## 2022-05-27 DIAGNOSIS — W5501XD Bitten by cat, subsequent encounter: Secondary | ICD-10-CM | POA: Diagnosis not present

## 2022-05-31 DIAGNOSIS — W5501XD Bitten by cat, subsequent encounter: Secondary | ICD-10-CM | POA: Diagnosis not present

## 2022-05-31 DIAGNOSIS — M79642 Pain in left hand: Secondary | ICD-10-CM | POA: Diagnosis not present

## 2022-06-24 DIAGNOSIS — Z125 Encounter for screening for malignant neoplasm of prostate: Secondary | ICD-10-CM | POA: Diagnosis not present

## 2022-06-24 DIAGNOSIS — D518 Other vitamin B12 deficiency anemias: Secondary | ICD-10-CM | POA: Diagnosis not present

## 2022-06-24 DIAGNOSIS — Z1331 Encounter for screening for depression: Secondary | ICD-10-CM | POA: Diagnosis not present

## 2022-06-24 DIAGNOSIS — Z6832 Body mass index (BMI) 32.0-32.9, adult: Secondary | ICD-10-CM | POA: Diagnosis not present

## 2022-06-24 DIAGNOSIS — E6609 Other obesity due to excess calories: Secondary | ICD-10-CM | POA: Diagnosis not present

## 2022-06-24 DIAGNOSIS — M1611 Unilateral primary osteoarthritis, right hip: Secondary | ICD-10-CM | POA: Diagnosis not present

## 2022-06-24 DIAGNOSIS — E039 Hypothyroidism, unspecified: Secondary | ICD-10-CM | POA: Diagnosis not present

## 2022-06-24 DIAGNOSIS — Z0001 Encounter for general adult medical examination with abnormal findings: Secondary | ICD-10-CM | POA: Diagnosis not present

## 2022-06-24 DIAGNOSIS — E559 Vitamin D deficiency, unspecified: Secondary | ICD-10-CM | POA: Diagnosis not present

## 2022-06-24 DIAGNOSIS — M1991 Primary osteoarthritis, unspecified site: Secondary | ICD-10-CM | POA: Diagnosis not present

## 2022-08-15 DIAGNOSIS — J0100 Acute maxillary sinusitis, unspecified: Secondary | ICD-10-CM | POA: Diagnosis not present

## 2022-08-15 DIAGNOSIS — J209 Acute bronchitis, unspecified: Secondary | ICD-10-CM | POA: Diagnosis not present

## 2022-08-29 NOTE — Patient Instructions (Signed)
DUE TO COVID-19 ONLY TWO VISITORS  (aged 78 and older)  ARE ALLOWED TO COME WITH YOU AND STAY IN THE WAITING ROOM ONLY DURING PRE OP AND PROCEDURE.   **NO VISITORS ARE ALLOWED IN THE SHORT STAY AREA OR RECOVERY ROOM!!**  IF YOU WILL BE ADMITTED INTO THE HOSPITAL YOU ARE ALLOWED ONLY FOUR SUPPORT PEOPLE DURING VISITATION HOURS ONLY (7 AM -8PM)   The support person(s) must pass our screening, gel in and out, and wear a mask at all times, including in the patient's room. Patients must also wear a mask when staff or their support person are in the room. Visitors GUEST BADGE MUST BE WORN VISIBLY  One adult visitor may remain with you overnight and MUST be in the room by 8 P.M.     Your procedure is scheduled on: 09/07/22   Report to [facility] Main Entrance    Report to admitting at  8:30 AM   Call this number if you have problems the morning of surgery [phone]   Do not eat food :After Midnight.   After Midnight you may have the following liquids until _8:00_____ AM/  DAY OF SURGERY  Water Black Coffee (sugar ok, NO MILK/CREAM OR CREAMERS)  Tea (sugar ok, NO MILK/CREAM OR CREAMERS) regular and decaf                             Plain Jell-O (NO RED)                                           Fruit ices (not with fruit pulp, NO RED)                                     Popsicles (NO RED)                                                                  Juice: apple, WHITE grape, WHITE cranberry Sports drinks like Gatorade (NO RED)                   The day of surgery:  Drink ONE (1) Pre-Surgery Clear Ensure  at 7:45 AM the morning of surgery. Drink in one sitting. Do not sip.  This drink was given to you during your hospital  pre-op appointment visit. Nothing else to drink after completing the  Pre-Surgery Clear Ensure at 8:00 AM          If you have questions, please contact your surgeon's office.     Oral Hygiene is also important to reduce your risk of infection.                                     Remember - BRUSH YOUR TEETH THE MORNING OF SURGERY WITH YOUR REGULAR TOOTHPASTE    Take these medicines the morning of surgery with A SIP OF WATER: Pain med if needed, Flagyl if still taking it  Bring CPAP mask and tubing day of surgery.                              You may not have any metal on your body including hair pins, jewelry, and body piercing             Do not wear make-up, lotions, powders, perfumes/cologne, or deodorant  Do not wear nail polish including gel and S&S, artificial/acrylic nails, or any other type of covering on natural nails including finger and toenails. If you have artificial nails, gel coating, etc. that needs to be removed by a nail salon please have this removed prior to surgery or surgery may need to be canceled/ delayed if the surgeon/ anesthesia feels like they are unable to be safely monitored.   Do not shave  48 hours prior to surgery.    Do not bring valuables to the hospital. [place].   Contacts, dentures or bridgework may not be worn into surgery.   Bring small overnight bag day of surgery.   DO NOT [place]. PHARMACY WILL DISPENSE MEDICATIONS LISTED ON YOUR MEDICATION LIST TO YOU DURING YOUR ADMISSION [place]!     Special Instructions: Bring a copy of your healthcare power of attorney and living will documents  the day of surgery if you haven't scanned them before.              Please read over the following fact sheets you were given: IF YOU HAVE QUESTIONS ABOUT YOUR PRE-OP INSTRUCTIONS PLEASE CALL [phone]    [facility] - Preparing for Surgery Before surgery, you can play an important role.  Because skin is not sterile, your skin needs to be as free of germs as possible.  You can reduce the number of germs on your skin by washing with CHG (chlorahexidine gluconate) soap before surgery.  CHG is an antiseptic  cleaner which kills germs and bonds with the skin to continue killing germs even after washing. Please DO NOT use if you have an allergy to CHG or antibacterial soaps.  If your skin becomes reddened/irritated stop using the CHG and inform your nurse when you arrive at Short Stay. Do not shave (including legs and underarms) for at least 48 hours prior to the first CHG shower.  Please follow these instructions carefully:  1.  Shower with CHG Soap the night before surgery and the  morning of Surgery.  2.  If you choose to wash your hair, wash your hair first as usual with your  normal  shampoo.  3.  After you shampoo, rinse your hair and body thoroughly to remove the  shampoo.                            4.  Use CHG as you would any other liquid soap.  You can apply chg directly  to the skin and wash                       Gently with a scrungie or clean washcloth.  5.  Apply the CHG Soap to your body ONLY FROM THE NECK DOWN.   Do not use on face/ open  Wound or open sores. Avoid contact with eyes, ears mouth and genitals (private parts).                       Wash face,  Genitals (private parts) with your normal soap.             6.  Wash thoroughly, paying special attention to the area where your surgery  will be performed.  7.  Thoroughly rinse your body with warm water from the neck down.  8.  DO NOT shower/wash with your normal soap after using and rinsing off  the CHG Soap.             9.  Pat yourself dry with a clean towel.            10.  Wear clean pajamas.            11.  Place clean sheets on your bed the night of your first shower and do not  sleep with pets. Day of Surgery : Do not apply any lotions/deodorants the morning of surgery.  Please wear clean clothes to the hospital/surgery center.  FAILURE TO FOLLOW THESE INSTRUCTIONS MAY RESULT IN THE CANCELLATION OF YOUR  SURGERY     ________________________________________________________________________  Incentive Spirometer  An incentive spirometer is a tool that can help keep your lungs clear and active. This tool measures how well you are filling your lungs with each breath. Taking long deep breaths may help reverse or decrease the chance of developing breathing (pulmonary) problems (especially infection) following: A long period of time when you are unable to move or be active. BEFORE THE PROCEDURE  If the spirometer includes an indicator to show your best effort, your nurse or respiratory therapist will set it to a desired goal. If possible, sit up straight or lean slightly forward. Try not to slouch. Hold the incentive spirometer in an upright position. INSTRUCTIONS FOR USE  Sit on the edge of your bed if possible, or sit up as far as you can in bed or on a chair. Hold the incentive spirometer in an upright position. Breathe out normally. Place the mouthpiece in your mouth and seal your lips tightly around it. Breathe in slowly and as deeply as possible, raising the piston or the ball toward the top of the column. Hold your breath for 3-5 seconds or for as long as possible. Allow the piston or ball to fall to the bottom of the column. Remove the mouthpiece from your mouth and breathe out normally. Rest for a few seconds and repeat Steps 1 through 7 at least 10 times every 1-2 hours when you are awake. Take your time and take a few normal breaths between deep breaths. The spirometer may include an indicator to show your best effort. Use the indicator as a goal to work toward during each repetition. After each set of 10 deep breaths, practice coughing to be sure your lungs are clear. If you have an incision (the cut made at the time of surgery), support your incision when coughing by placing a pillow or rolled up towels firmly against it. Once you are able to get out of bed, walk around indoors and  cough well. You may stop using the incentive spirometer when instructed by your caregiver.  RISKS AND COMPLICATIONS Take your time so you do not get dizzy or light-headed. If you are in pain, you may need to take or ask for pain medication before doing  incentive spirometry. It is harder to take a deep breath if you are having pain. AFTER USE Rest and breathe slowly and easily. It can be helpful to keep track of a log of your progress. Your caregiver can provide you with a simple table to help with this. If you are using the spirometer at home, follow these instructions: [place] IF:  You are having difficultly using the spirometer. You have trouble using the spirometer as often as instructed. Your pain medication is not giving enough relief while using the spirometer. You develop fever of 100.5 F (38.1 C) or higher. SEEK IMMEDIATE MEDICAL CARE IF:  You cough up bloody sputum that had not been present before. You develop fever of 102 F (38.9 C) or greater. You develop worsening pain at or near the incision site. MAKE SURE YOU:  Understand these instructions. Will watch your condition. Will get help right away if you are not doing well or get worse. Document Released: 02/20/2007 Document Revised: 01/02/2012 Document Reviewed: 04/23/2007 [facility] Patient Information 2014 [place], [place].   ________________________________________________________________________

## 2022-09-02 ENCOUNTER — Encounter (HOSPITAL_COMMUNITY): Payer: Self-pay

## 2022-09-02 ENCOUNTER — Encounter (HOSPITAL_COMMUNITY)
Admission: RE | Admit: 2022-09-02 | Discharge: 2022-09-02 | Disposition: A | Discharge status: Home / Self Care | Payer: Medicare Other | Source: Ambulatory Visit | Attending: Orthopedic Surgery | Admitting: Orthopedic Surgery

## 2022-09-02 ENCOUNTER — Other Ambulatory Visit: Payer: Self-pay

## 2022-09-02 DIAGNOSIS — Z01818 Encounter for other preprocedural examination: Secondary | ICD-10-CM | POA: Diagnosis not present

## 2022-09-02 HISTORY — DX: Gastro-esophageal reflux disease without esophagitis: K21.9

## 2022-09-02 HISTORY — DX: Peripheral vascular disease, unspecified: I73.9

## 2022-09-02 HISTORY — DX: Unspecified osteoarthritis, unspecified site: M19.90

## 2022-09-02 LAB — SURGICAL PCR SCREEN
MRSA, PCR: NEGATIVE
Staphylococcus aureus: NEGATIVE

## 2022-09-02 LAB — CBC
HCT: 42.5 % (ref 36.0–46.0)
Hemoglobin: 13.9 g/dL (ref 12.0–15.0)
MCH: 31.6 pg (ref 26.0–34.0)
MCHC: 32.7 g/dL (ref 30.0–36.0)
MCV: 96.6 fL (ref 80.0–100.0)
Platelets: 175 10*3/uL (ref 150–400)
RBC: 4.4 MIL/uL (ref 3.87–5.11)
RDW: 11.9 % (ref 11.5–15.5)
WBC: 6.8 10*3/uL (ref 4.0–10.5)
nRBC: 0.3 % — ABNORMAL HIGH (ref 0.0–0.2)

## 2022-09-02 LAB — BASIC METABOLIC PANEL
Anion gap: 7 (ref 5–15)
BUN: 12 mg/dL (ref 8–23)
CO2: 27 mmol/L (ref 22–32)
Calcium: 9.5 mg/dL (ref 8.9–10.3)
Chloride: 106 mmol/L (ref 98–111)
Creatinine, Ser: 0.71 mg/dL (ref 0.44–1.00)
GFR, Estimated: >60 mL/min (ref >60)
Glucose, Bld: 97 mg/dL (ref 70–99)
Potassium: 3.8 mmol/L (ref 3.5–5.1)
Sodium: 140 mmol/L (ref 135–145)

## 2022-09-02 NOTE — H&P (Signed)
TOTAL HIP ADMISSION H&P  Patient is admitted for right total hip arthroplasty.  Subjective:  Chief Complaint: Right hip pain  HPI: Theresa Pierce, 77 y.o. female, has a history of pain and functional disability in the right hip due to arthritis and patient has failed non-surgical conservative treatments for greater than 12 weeks to include NSAID's and/or analgesics and activity modification. Onset of symptoms was gradual, starting  several  years ago with gradually worsening course since that time. The patient noted no past surgery on the right hip. Patient currently rates pain in the right hip at 7 out of 10 with activity. Patient has night pain, worsening of pain with activity and weight bearing, pain that interfers with activities of daily living, and pain with passive range of motion. Patient has evidence of  bone-on-bone arthritis in the right hip with large osteophyte formation  by imaging studies. This condition presents safety issues increasing the risk of falls. There is no current active infection.  Patient Active Problem List   Diagnosis Date Noted   Cat bite of finger 03/12/2019   Varicose veins of left lower extremity with complications 27/78/2423   Ankle fracture 01/29/2013   Lateral malleolar fracture 10/31/2012    Past Medical History:  Diagnosis Date   Broken ankle     Past Surgical History:  Procedure Laterality Date   I & D EXTREMITY Left 03/12/2019   Procedure: IRRIGATION AND DEBRIDEMENT EXTREMITY;  Surgeon: Roseanne Kaufman, MD;  Location: [facility];  Service: Orthopedics;  Laterality: Left;   laser ablations bilat GSV 2012 Bilateral 2012    Prior to Admission medications   Medication Sig Start Date End Date Taking? Authorizing Provider  acetaminophen (TYLENOL) 500 MG tablet Take 1,000-1,500 mg by mouth every 6 (six) hours as needed (pain.).   Yes [provider]  ibuprofen (ADVIL) 200 MG tablet Take 400 mg by mouth every 8 (eight) hours as needed (pain.).    Yes [provider]  levofloxacin (LEVAQUIN) 500 MG tablet Take 500 mg by mouth daily.   Yes [provider]  omeprazole (PRILOSEC) 20 MG capsule Take 20 mg by mouth at bedtime.   Yes [provider]  Polyethyl Glycol-Propyl Glycol (SYSTANE) 0.4-0.3 % SOLN Place 1-2 drops into both eyes 3 (three) times daily as needed (dry/irritated eyes.).   Yes [provider]  Ascorbic Acid (VITAMIN C) 1000 MG tablet Take 1,000 mg by mouth in the morning. With rose hips    [provider]  Biotin 1000 MCG tablet Take 1,000 mcg by mouth in the morning.    [provider]  doxycycline (VIBRAMYCIN) 100 MG capsule Take 1 capsule (100 mg total) by mouth 2 (two) times daily. One po bid x 7 days Patient not taking: Reported on 09/01/2022 05/21/22   Veryl Speak, MD  Glucosamine HCl (GLUCOSAMINE PO) Take 1 tablet by mouth daily.    [provider]  metroNIDAZOLE (FLAGYL) 500 MG tablet Take 1 tablet (500 mg total) by mouth 3 (three) times daily. Patient not taking: Reported on 09/01/2022 05/21/22   Veryl Speak, MD    Allergies  Allergen Reactions   Amoxicillin Other (See Comments)    Did it involve swelling of the face/tongue/throat, SOB, or low BP? No Did it involve sudden or severe rash/hives, skin peeling, or any reaction on the inside of your mouth or nose? No Did you need to seek medical attention at a hospital or doctor's office? Yes When did it last happen?  Her legs burned    If all above answers are "NO", may proceed with cephalosporin use.    Codeine Itching    Social History   Socioeconomic History   Marital status: Married    Spouse name: Not on file   Number of children: Not on file   Years of education: Not on file   Highest education level: Not on file  Occupational History   Not on file  Tobacco Use   Smoking status: Never   Smokeless tobacco: Never  Substance and Sexual Activity   Alcohol use: No   Drug use: No   Sexual  activity: Not on file  Other Topics Concern   Not on file  Social History Narrative   Not on file   Social Determinants of Health   Financial Resource Strain: Not on file  Food Insecurity: Not on file  Transportation Needs: Not on file  Physical Activity: Not on file  Stress: Not on file  Social Connections: Not on file  Intimate Partner Violence: Not on file    Tobacco Use: Low Risk  (05/20/2022)   Patient History    Smoking Tobacco Use: Never    Smokeless Tobacco Use: Never    Passive Exposure: Not on file   Social History   Substance and Sexual Activity  Alcohol Use No    Family History  Problem Relation Age of Onset   Heart disease Other    Cancer Other    Arthritis Other     Review of Systems  Constitutional:  Negative for chills and fever.  HENT:  Negative for congestion, sore throat and tinnitus.   Eyes:  Negative for double vision, photophobia and pain.  Respiratory:  Negative for cough, shortness of breath and wheezing.   Cardiovascular:  Negative for chest pain, palpitations and orthopnea.  Gastrointestinal:  Negative for heartburn, nausea and vomiting.  Genitourinary:  Negative for dysuria, frequency and urgency.  Musculoskeletal:  Positive for joint pain.  Neurological:  Negative for dizziness, weakness and headaches.     Objective:  Physical Exam: Well nourished and well developed.  General: Alert and oriented x3, cooperative and pleasant, no acute distress.  Head: normocephalic, atraumatic, neck supple.  Eyes: EOMI.  Musculoskeletal:  Right Hip Exam:  The range of motion: Flexion to 90 degrees, Internal Rotation to 0 degrees, External Rotation to 0 degrees, and abduction to 10 degrees without discomfort.  There is no tenderness over the greater trochanteric bursa.  Calves soft and nontender. Motor function intact in LE. Strength 5/5 LE bilaterally. Neuro: Distal pulses 2+. Sensation to light touch intact in LE.  Imaging Review Plain  radiographs demonstrate severe degenerative joint disease of the right hip. The bone quality appears to be adequate for age and reported activity level.  Assessment/Plan:  End stage arthritis, right hip  The patient history, physical examination, clinical judgement of the provider and imaging studies are consistent with end stage degenerative joint disease of the right hip and total hip arthroplasty is deemed medically necessary. The treatment options including medical management, injection therapy, arthroscopy and arthroplasty were discussed at length. The risks and benefits of total hip arthroplasty were presented and reviewed. The risks due to aseptic loosening, infection, stiffness, dislocation/subluxation, thromboembolic complications and other imponderables were discussed. The patient acknowledged the explanation, agreed to proceed with the plan and consent was signed. Patient is being admitted for inpatient treatment for surgery, pain control, PT, OT, prophylactic antibiotics, VTE prophylaxis, progressive ambulation and ADLs and discharge planning.The  patient is planning to be discharged  home .   Patient's anticipated LOS is less than 2 midnights, meeting these requirements: - Lives within 1 hour of care - Has a competent adult at home to recover with post-op recover - NO history of  - Chronic pain requiring opioids  - Diabetes  - Coronary Artery Disease  - Heart failure  - Heart attack  - Stroke  - DVT/VTE  - Cardiac arrhythmia  - Respiratory Failure/COPD  - Renal failure  - Anemia  - Advanced Liver disease  Therapy Plans: HEP Disposition: Home with husband Planned DVT Prophylaxis: Aspirin 325 mg BID DME Needed: None PCP: Redmond School, MD (clearance received) TXA: IV Allergies: Amoxicillin (burning in legs), codeine (itching - has not had in 50 years) Anesthesia Concerns: None BMI: 33.8 Last HgbA1c: Not diabetic.  Pharmacy: [facility]  - Patient was  instructed on what medications to stop prior to surgery. - Follow-up visit in 2 weeks with Dr. Wynelle Link - Begin physical therapy following surgery - Pre-operative lab work as pre-surgical testing - Prescriptions will be provided in hospital at time of discharge  Theresa Duty, PA-C Orthopedic Surgery [facility] [place]

## 2022-09-02 NOTE — Progress Notes (Signed)
Anesthesia note:  Bowel prep reminder:  NA  PCP - Dr. Selena Batten Cardiologist -none Other-   Chest x-ray - 08/18/21-epic EKG - 09/02/22-chart Stress Test - no ECHO - no Cardiac Cath - no CABG-no Pacemaker/ICD device last checked:no  Sleep Study - no CPAP - no  Pt is pre diabetic-no CBG at PAT visit- Fasting Blood Sugar at home- Checks Blood Sugar _____  Blood Thinner:no Blood Thinner Instructions: Aspirin Instructions: Last Dose:  Anesthesia review: Yes   reason:Covid 08/17/22  Patient denies shortness of breath, fever, cough and chest pain at PAT appointment. No SOB now but Pt has a cough. She feels it is due to being off of her Omeprazole while on antibiotics for tooth abscess wed 08/24/22 and a Z pac given 10/25 23 for covid. I told her to call her surgeon if cough doesn't get better when resuming the omeprazole on 11/11. She has white coat syndromand BP was up at PAT visit. She will moitor it at home and call her PCP if it is high.  Patient verbalized understanding of instructions that were given to them at the PAT appointment. Patient was also instructed that they will need to review over the PAT instructions again at home before surgery.yes

## 2022-09-07 ENCOUNTER — Encounter (HOSPITAL_COMMUNITY): Payer: Self-pay | Admitting: Orthopedic Surgery

## 2022-09-07 ENCOUNTER — Ambulatory Visit (HOSPITAL_COMMUNITY): Payer: Medicare Other | Admitting: Physician Assistant

## 2022-09-07 ENCOUNTER — Ambulatory Visit (HOSPITAL_COMMUNITY): Payer: Medicare Other

## 2022-09-07 ENCOUNTER — Other Ambulatory Visit: Payer: Self-pay

## 2022-09-07 ENCOUNTER — Observation Stay (HOSPITAL_COMMUNITY): Payer: Medicare Other

## 2022-09-07 ENCOUNTER — Inpatient Hospital Stay (HOSPITAL_COMMUNITY)
Admission: RE | Admit: 2022-09-07 | Discharge: 2022-09-12 | DRG: 470 | Disposition: A | Discharge status: Home / Self Care | Payer: Medicare Other | Attending: Orthopedic Surgery | Admitting: Orthopedic Surgery

## 2022-09-07 ENCOUNTER — Encounter (HOSPITAL_COMMUNITY)
Admission: RE | Disposition: A | Discharge status: Home / Self Care | Payer: Self-pay | Source: Home / Self Care | Attending: Orthopedic Surgery

## 2022-09-07 ENCOUNTER — Ambulatory Visit (HOSPITAL_BASED_OUTPATIENT_CLINIC_OR_DEPARTMENT_OTHER): Payer: Medicare Other | Admitting: Certified Registered Nurse Anesthetist

## 2022-09-07 DIAGNOSIS — M1611 Unilateral primary osteoarthritis, right hip: Secondary | ICD-10-CM | POA: Diagnosis not present

## 2022-09-07 DIAGNOSIS — I739 Peripheral vascular disease, unspecified: Secondary | ICD-10-CM | POA: Diagnosis present

## 2022-09-07 DIAGNOSIS — Z880 Allergy status to penicillin: Secondary | ICD-10-CM

## 2022-09-07 DIAGNOSIS — Z01818 Encounter for other preprocedural examination: Principal | ICD-10-CM

## 2022-09-07 DIAGNOSIS — M169 Osteoarthritis of hip, unspecified: Secondary | ICD-10-CM | POA: Diagnosis present

## 2022-09-07 DIAGNOSIS — Z96641 Presence of right artificial hip joint: Secondary | ICD-10-CM | POA: Diagnosis not present

## 2022-09-07 DIAGNOSIS — K219 Gastro-esophageal reflux disease without esophagitis: Secondary | ICD-10-CM | POA: Diagnosis present

## 2022-09-07 DIAGNOSIS — Z471 Aftercare following joint replacement surgery: Secondary | ICD-10-CM | POA: Diagnosis not present

## 2022-09-07 DIAGNOSIS — Z885 Allergy status to narcotic agent status: Secondary | ICD-10-CM

## 2022-09-07 DIAGNOSIS — Z79899 Other long term (current) drug therapy: Secondary | ICD-10-CM | POA: Diagnosis not present

## 2022-09-07 HISTORY — PX: TOTAL HIP ARTHROPLASTY: SHX124

## 2022-09-07 LAB — TYPE AND SCREEN
ABO/RH(D): AB POS
Antibody Screen: NEGATIVE

## 2022-09-07 SURGERY — ARTHROPLASTY, HIP, TOTAL, ANTERIOR APPROACH
Anesthesia: Monitor Anesthesia Care | Site: Hip | Laterality: Right

## 2022-09-07 MED ORDER — CHLORHEXIDINE GLUCONATE 0.12 % MT SOLN
15.0000 mL | Freq: Once | OROMUCOSAL | Status: AC
  Administered 2022-09-07: 15 mL via OROMUCOSAL

## 2022-09-07 MED ORDER — METOCLOPRAMIDE HCL 5 MG/ML IJ SOLN: 5.0000 mg | Freq: Three times a day (TID) | INTRAMUSCULAR | Status: DC | PRN

## 2022-09-07 MED ORDER — DIPHENHYDRAMINE HCL 12.5 MG/5ML PO ELIX: 12.5000 mg | ORAL_SOLUTION | ORAL | Status: DC | PRN

## 2022-09-07 MED ORDER — HYDROMORPHONE HCL 1 MG/ML IJ SOLN
0.5000 mg | INTRAMUSCULAR | Status: DC | PRN
  Administered 2022-09-07: 0.5 mg via INTRAVENOUS
  Administered 2022-09-07: 1 mg via INTRAVENOUS
  Filled 2022-09-07 (×2): qty 1

## 2022-09-07 MED ORDER — DEXAMETHASONE SODIUM PHOSPHATE 10 MG/ML IJ SOLN
INTRAMUSCULAR | Status: AC
  Filled 2022-09-07: qty 1

## 2022-09-07 MED ORDER — POVIDONE-IODINE 10 % EX SWAB
2.0000 | Freq: Once | CUTANEOUS | Status: AC
  Administered 2022-09-07: 2 via TOPICAL

## 2022-09-07 MED ORDER — ONDANSETRON HCL 4 MG/2ML IJ SOLN
INTRAMUSCULAR | Status: AC
  Filled 2022-09-07: qty 2

## 2022-09-07 MED ORDER — DEXAMETHASONE SODIUM PHOSPHATE 10 MG/ML IJ SOLN
10.0000 mg | Freq: Once | INTRAMUSCULAR | Status: AC
  Administered 2022-09-08: 10 mg via INTRAVENOUS
  Filled 2022-09-07: qty 1

## 2022-09-07 MED ORDER — PROPOFOL 10 MG/ML IV BOLUS
INTRAVENOUS | Status: AC
  Filled 2022-09-07: qty 20

## 2022-09-07 MED ORDER — DOCUSATE SODIUM 100 MG PO CAPS
100.0000 mg | ORAL_CAPSULE | Freq: Two times a day (BID) | ORAL | Status: DC
  Administered 2022-09-07 – 2022-09-12 (×10): 100 mg via ORAL
  Filled 2022-09-07 (×10): qty 1

## 2022-09-07 MED ORDER — DEXAMETHASONE SODIUM PHOSPHATE 10 MG/ML IJ SOLN
8.0000 mg | Freq: Once | INTRAMUSCULAR | Status: AC
  Administered 2022-09-07: 8 mg via INTRAVENOUS

## 2022-09-07 MED ORDER — BUPIVACAINE IN DEXTROSE 0.75-8.25 % IT SOLN
INTRATHECAL | Status: DC | PRN
  Administered 2022-09-07: 2 mL via INTRATHECAL

## 2022-09-07 MED ORDER — FENTANYL CITRATE (PF) 100 MCG/2ML IJ SOLN
INTRAMUSCULAR | Status: DC | PRN
  Administered 2022-09-07: 25 ug via INTRAVENOUS
  Administered 2022-09-07 (×2): 50 ug via INTRAVENOUS

## 2022-09-07 MED ORDER — POLYETHYLENE GLYCOL 3350 17 G PO PACK: 17.0000 g | PACK | Freq: Every day | ORAL | Status: DC | PRN

## 2022-09-07 MED ORDER — ACETAMINOPHEN 10 MG/ML IV SOLN: 1000.0000 mg | Freq: Once | INTRAVENOUS | Status: DC | PRN

## 2022-09-07 MED ORDER — FLEET ENEMA 7-19 GM/118ML RE ENEM: 1.0000 | ENEMA | Freq: Once | RECTAL | Status: DC | PRN

## 2022-09-07 MED ORDER — ONDANSETRON HCL 4 MG/2ML IJ SOLN: 4.0000 mg | Freq: Four times a day (QID) | INTRAMUSCULAR | Status: DC | PRN

## 2022-09-07 MED ORDER — ORAL CARE MOUTH RINSE: 15.0000 mL | Freq: Once | OROMUCOSAL | Status: AC

## 2022-09-07 MED ORDER — MIDAZOLAM HCL 2 MG/2ML IJ SOLN
INTRAMUSCULAR | Status: DC | PRN
  Administered 2022-09-07 (×2): 1 mg via INTRAVENOUS

## 2022-09-07 MED ORDER — ACETAMINOPHEN 500 MG PO TABS: 1000.0000 mg | ORAL_TABLET | Freq: Once | ORAL | Status: DC | PRN

## 2022-09-07 MED ORDER — METOCLOPRAMIDE HCL 5 MG PO TABS: 5.0000 mg | ORAL_TABLET | Freq: Three times a day (TID) | ORAL | Status: DC | PRN

## 2022-09-07 MED ORDER — ACETAMINOPHEN 10 MG/ML IV SOLN
1000.0000 mg | Freq: Once | INTRAVENOUS | Status: AC
  Administered 2022-09-07: 1000 mg via INTRAVENOUS
  Filled 2022-09-07: qty 100

## 2022-09-07 MED ORDER — PROPOFOL 500 MG/50ML IV EMUL
INTRAVENOUS | Status: DC | PRN
  Administered 2022-09-07: 75 ug/kg/min via INTRAVENOUS

## 2022-09-07 MED ORDER — BUPIVACAINE-EPINEPHRINE (PF) 0.5% -1:200000 IJ SOLN
INTRAMUSCULAR | Status: DC | PRN
  Administered 2022-09-07: 30 mL via PERINEURAL

## 2022-09-07 MED ORDER — WATER FOR IRRIGATION, STERILE IR SOLN
Status: DC | PRN
  Administered 2022-09-07: 1000 mL

## 2022-09-07 MED ORDER — ONDANSETRON HCL 4 MG PO TABS: 4.0000 mg | ORAL_TABLET | Freq: Four times a day (QID) | ORAL | Status: DC | PRN

## 2022-09-07 MED ORDER — LACTATED RINGERS IV SOLN: INTRAVENOUS | Status: DC

## 2022-09-07 MED ORDER — FENTANYL CITRATE (PF) 100 MCG/2ML IJ SOLN
INTRAMUSCULAR | Status: AC
  Filled 2022-09-07: qty 2

## 2022-09-07 MED ORDER — POLYVINYL ALCOHOL 1.4 % OP SOLN: 1.0000 [drp] | Freq: Three times a day (TID) | OPHTHALMIC | Status: DC | PRN

## 2022-09-07 MED ORDER — METHOCARBAMOL 1000 MG/10ML IJ SOLN
500.0000 mg | Freq: Four times a day (QID) | INTRAVENOUS | Status: DC | PRN
  Filled 2022-09-07: qty 5

## 2022-09-07 MED ORDER — SODIUM CHLORIDE 0.9 % IV SOLN: INTRAVENOUS | Status: DC

## 2022-09-07 MED ORDER — PANTOPRAZOLE SODIUM 40 MG PO TBEC
80.0000 mg | DELAYED_RELEASE_TABLET | Freq: Every day | ORAL | Status: DC
  Administered 2022-09-07 – 2022-09-12 (×6): 80 mg via ORAL
  Filled 2022-09-07 (×6): qty 2

## 2022-09-07 MED ORDER — METHOCARBAMOL 500 MG PO TABS
500.0000 mg | ORAL_TABLET | Freq: Four times a day (QID) | ORAL | Status: DC | PRN
  Administered 2022-09-08 – 2022-09-12 (×10): 500 mg via ORAL
  Filled 2022-09-07 (×10): qty 1

## 2022-09-07 MED ORDER — TRANEXAMIC ACID-NACL 1000-0.7 MG/100ML-% IV SOLN
1000.0000 mg | INTRAVENOUS | Status: AC
  Administered 2022-09-07: 1000 mg via INTRAVENOUS
  Filled 2022-09-07: qty 100

## 2022-09-07 MED ORDER — LACTATED RINGERS IV SOLN
INTRAVENOUS | Status: DC
Start: 2022-09-07 — End: 2022-09-07

## 2022-09-07 MED ORDER — ACETAMINOPHEN 325 MG PO TABS: 325.0000 mg | ORAL_TABLET | Freq: Four times a day (QID) | ORAL | Status: DC | PRN

## 2022-09-07 MED ORDER — FENTANYL CITRATE PF 50 MCG/ML IJ SOSY: 25.0000 ug | PREFILLED_SYRINGE | INTRAMUSCULAR | Status: DC | PRN

## 2022-09-07 MED ORDER — BISACODYL 10 MG RE SUPP: 10.0000 mg | Freq: Every day | RECTAL | Status: DC | PRN

## 2022-09-07 MED ORDER — BUPIVACAINE-EPINEPHRINE (PF) 0.5% -1:200000 IJ SOLN
INTRAMUSCULAR | Status: AC
  Filled 2022-09-07: qty 30

## 2022-09-07 MED ORDER — PHENOL 1.4 % MT LIQD: 1.0000 | OROMUCOSAL | Status: DC | PRN

## 2022-09-07 MED ORDER — MIDAZOLAM HCL 2 MG/2ML IJ SOLN
INTRAMUSCULAR | Status: AC
  Filled 2022-09-07: qty 2

## 2022-09-07 MED ORDER — CEFAZOLIN SODIUM-DEXTROSE 2-4 GM/100ML-% IV SOLN
2.0000 g | Freq: Four times a day (QID) | INTRAVENOUS | Status: AC
  Administered 2022-09-07 (×2): 2 g via INTRAVENOUS
  Filled 2022-09-07 (×2): qty 100

## 2022-09-07 MED ORDER — ACETAMINOPHEN 160 MG/5ML PO SOLN: 1000.0000 mg | Freq: Once | ORAL | Status: DC | PRN

## 2022-09-07 MED ORDER — OXYCODONE HCL 5 MG/5ML PO SOLN: 5.0000 mg | Freq: Once | ORAL | Status: DC | PRN

## 2022-09-07 MED ORDER — TRAMADOL HCL 50 MG PO TABS
50.0000 mg | ORAL_TABLET | Freq: Four times a day (QID) | ORAL | Status: DC | PRN
  Administered 2022-09-12: 100 mg via ORAL
  Administered 2022-09-12: 50 mg via ORAL
  Filled 2022-09-07: qty 2
  Filled 2022-09-07: qty 1

## 2022-09-07 MED ORDER — POLYETHYL GLYCOL-PROPYL GLYCOL 0.4-0.3 % OP SOLN: 1.0000 [drp] | Freq: Three times a day (TID) | OPHTHALMIC | Status: DC | PRN

## 2022-09-07 MED ORDER — CEFAZOLIN SODIUM-DEXTROSE 2-4 GM/100ML-% IV SOLN
2.0000 g | INTRAVENOUS | Status: AC
  Administered 2022-09-07: 2 g via INTRAVENOUS
  Filled 2022-09-07: qty 100

## 2022-09-07 MED ORDER — HYDROCODONE-ACETAMINOPHEN 5-325 MG PO TABS
1.0000 | ORAL_TABLET | ORAL | Status: DC | PRN
  Administered 2022-09-07 – 2022-09-08 (×2): 2 via ORAL
  Administered 2022-09-08: 1 via ORAL
  Administered 2022-09-08 (×2): 2 via ORAL
  Administered 2022-09-09: 1 via ORAL
  Administered 2022-09-09 (×2): 2 via ORAL
  Administered 2022-09-10: 1 via ORAL
  Administered 2022-09-10: 2 via ORAL
  Administered 2022-09-10: 1 via ORAL
  Administered 2022-09-10 (×2): 2 via ORAL
  Administered 2022-09-11 (×2): 1 via ORAL
  Administered 2022-09-11: 2 via ORAL
  Administered 2022-09-11: 1 via ORAL
  Administered 2022-09-11 – 2022-09-12 (×2): 2 via ORAL
  Administered 2022-09-12: 1 via ORAL
  Filled 2022-09-07 (×2): qty 2
  Filled 2022-09-07: qty 1
  Filled 2022-09-07 (×2): qty 2
  Filled 2022-09-07: qty 1
  Filled 2022-09-07: qty 2
  Filled 2022-09-07 (×3): qty 1
  Filled 2022-09-07 (×2): qty 2
  Filled 2022-09-07 (×2): qty 1
  Filled 2022-09-07 (×2): qty 2
  Filled 2022-09-07: qty 1
  Filled 2022-09-07 (×3): qty 2

## 2022-09-07 MED ORDER — MENTHOL 3 MG MT LOZG: 1.0000 | LOZENGE | OROMUCOSAL | Status: DC | PRN

## 2022-09-07 MED ORDER — OXYCODONE HCL 5 MG PO TABS: 5.0000 mg | ORAL_TABLET | Freq: Once | ORAL | Status: DC | PRN

## 2022-09-07 MED ORDER — 0.9 % SODIUM CHLORIDE (POUR BTL) OPTIME
TOPICAL | Status: DC | PRN
  Administered 2022-09-07: 1000 mL

## 2022-09-07 MED ORDER — ASPIRIN 325 MG PO TBEC
325.0000 mg | DELAYED_RELEASE_TABLET | Freq: Two times a day (BID) | ORAL | Status: DC
  Administered 2022-09-08 – 2022-09-12 (×9): 325 mg via ORAL
  Filled 2022-09-07 (×9): qty 1

## 2022-09-07 MED ORDER — PHENYLEPHRINE HCL-NACL 20-0.9 MG/250ML-% IV SOLN
INTRAVENOUS | Status: DC | PRN
  Administered 2022-09-07: 40 ug/min via INTRAVENOUS

## 2022-09-07 SURGICAL SUPPLY — 45 items
BAG COUNTER SPONGE SURGICOUNT (BAG) IMPLANT
BAG DECANTER FOR FLEXI CONT (MISCELLANEOUS) IMPLANT
BAG SPEC THK2 15X12 ZIP CLS (MISCELLANEOUS)
BAG SPNG CNTER NS LX DISP (BAG)
BAG ZIPLOCK 12X15 (MISCELLANEOUS) IMPLANT
BLADE SAG 18X100X1.27 (BLADE) ×1 IMPLANT
COVER PERINEAL POST (MISCELLANEOUS) ×1 IMPLANT
COVER SURGICAL LIGHT HANDLE (MISCELLANEOUS) ×1 IMPLANT
CUP ACETBLR 52 OD PINNACLE (Hips) IMPLANT
DRAPE FOOT SWITCH (DRAPES) ×1 IMPLANT
DRAPE STERI IOBAN 125X83 (DRAPES) ×1 IMPLANT
DRAPE U-SHAPE 47X51 STRL (DRAPES) ×2 IMPLANT
DRESSING AQUACEL AG SP 3.5X10 (GAUZE/BANDAGES/DRESSINGS) IMPLANT
DRSG AQUACEL AG ADV 3.5X10 (GAUZE/BANDAGES/DRESSINGS) ×1 IMPLANT
DRSG AQUACEL AG SP 3.5X10 (GAUZE/BANDAGES/DRESSINGS) ×1
DURAPREP 26ML APPLICATOR (WOUND CARE) ×1 IMPLANT
ELECT REM PT RETURN 15FT ADLT (MISCELLANEOUS) ×1 IMPLANT
GLOVE BIO SURGEON STRL SZ 6.5 (GLOVE) IMPLANT
GLOVE BIO SURGEON STRL SZ7.5 (GLOVE) IMPLANT
GLOVE BIO SURGEON STRL SZ8 (GLOVE) ×1 IMPLANT
GLOVE BIOGEL PI IND STRL 6.5 (GLOVE) IMPLANT
GLOVE BIOGEL PI IND STRL 7.0 (GLOVE) IMPLANT
GLOVE BIOGEL PI IND STRL 8 (GLOVE) ×1 IMPLANT
GOWN STRL REUS W/ TWL LRG LVL3 (GOWN DISPOSABLE) ×1 IMPLANT
GOWN STRL REUS W/ TWL XL LVL3 (GOWN DISPOSABLE) IMPLANT
GOWN STRL REUS W/TWL LRG LVL3 (GOWN DISPOSABLE) ×1
GOWN STRL REUS W/TWL XL LVL3 (GOWN DISPOSABLE)
HEAD FEM STD 32X+1 STRL (Hips) IMPLANT
HOLDER FOLEY CATH W/STRAP (MISCELLANEOUS) ×1 IMPLANT
KIT TURNOVER KIT A (KITS) IMPLANT
LINER MARATHON NEUT +4X52X32 (Hips) IMPLANT
MANIFOLD NEPTUNE II (INSTRUMENTS) ×1 IMPLANT
PACK ANTERIOR HIP CUSTOM (KITS) ×1 IMPLANT
PENCIL SMOKE EVACUATOR COATED (MISCELLANEOUS) ×1 IMPLANT
SPIKE FLUID TRANSFER (MISCELLANEOUS) ×1 IMPLANT
STEM FEMORAL SZ 6MM STD ACTIS (Stem) IMPLANT
STRIP CLOSURE SKIN 1/2X4 (GAUZE/BANDAGES/DRESSINGS) ×1 IMPLANT
SUT ETHIBOND NAB CT1 #1 30IN (SUTURE) ×1 IMPLANT
SUT MNCRL AB 4-0 PS2 18 (SUTURE) ×1 IMPLANT
SUT STRATAFIX 0 PDS 27 VIOLET (SUTURE) ×1
SUT VIC AB 2-0 CT1 27 (SUTURE) ×2
SUT VIC AB 2-0 CT1 TAPERPNT 27 (SUTURE) ×2 IMPLANT
SUTURE STRATFX 0 PDS 27 VIOLET (SUTURE) ×1 IMPLANT
TRAY FOLEY MTR SLVR 16FR STAT (SET/KITS/TRAYS/PACK) ×1 IMPLANT
TUBE SUCTION HIGH CAP CLEAR NV (SUCTIONS) ×1 IMPLANT

## 2022-09-07 NOTE — Anesthesia Postprocedure Evaluation (Signed)
Anesthesia Post Note  Patient: Theresa Pierce  Procedure(s) Performed: TOTAL HIP ARTHROPLASTY ANTERIOR APPROACH (Right: Hip)     Patient location during evaluation: PACU Anesthesia Type: MAC and Spinal Level of consciousness: awake and alert Pain management: pain level controlled Vital Signs Assessment: post-procedure vital signs reviewed and stable Respiratory status: spontaneous breathing, nonlabored ventilation and respiratory function stable Cardiovascular status: stable and blood pressure returned to baseline Postop Assessment: no apparent nausea or vomiting Anesthetic complications: no   No notable events documented.  Last Vitals:  Vitals:   09/07/22 1451 09/07/22 1754  BP: (!) 156/85 (!) 161/73  Pulse: 80 83  Resp: 16 18  Temp: 36.7 C 37.2 C  SpO2: 100% 100%    Last Pain:  Vitals:   09/07/22 1808  TempSrc:   PainSc: 2                  General Wearing

## 2022-09-07 NOTE — Op Note (Signed)
OPERATIVE REPORT- TOTAL HIP ARTHROPLASTY   PREOPERATIVE DIAGNOSIS: Osteoarthritis of the Right hip.   POSTOPERATIVE DIAGNOSIS: Osteoarthritis of the Right  hip.   PROCEDURE: Right total hip arthroplasty, anterior approach.   SURGEON: Gaynelle Arabian, MD   ASSISTANT: Theresa Duty, PA-C  ANESTHESIA:  Spinal  ESTIMATED BLOOD LOSS:-550 mL    DRAINS: None  COMPLICATIONS: None   CONDITION: PACU - hemodynamically stable.   BRIEF CLINICAL NOTE: Theresa Pierce is a 76 y.o. female who has advanced end-  stage arthritis of their Right  hip with progressively worsening pain and  dysfunction.The patient has failed nonoperative management and presents for  total hip arthroplasty.   PROCEDURE IN DETAIL: After successful administration of spinal  anesthetic, the traction boots for the [facility] bed were placed on both  feet and the patient was placed onto the [facility] bed, boots placed into the leg  holders. The Right hip was then isolated from the perineum with plastic  drapes and prepped and draped in the usual sterile fashion. ASIS and  greater trochanter were marked and a oblique incision was made, starting  at about 1 cm lateral and 2 cm distal to the ASIS and coursing towards  the anterior cortex of the femur. The skin was cut with a 10 blade  through subcutaneous tissue to the level of the fascia overlying the  tensor fascia lata muscle. The fascia was then incised in line with the  incision at the junction of the anterior third and posterior 2/3rd. The  muscle was teased off the fascia and then the interval between the TFL  and the rectus was developed. The Hohmann retractor was then placed at  the top of the femoral neck over the capsule. The vessels overlying the  capsule were cauterized and the fat on top of the capsule was removed.  A Hohmann retractor was then placed anterior underneath the rectus  femoris to give exposure to the entire anterior capsule. A T-shaped   capsulotomy was performed. The edges were tagged and the femoral head  was identified.       Osteophytes are removed off the superior acetabulum.  The femoral neck was then cut in situ with an oscillating saw. Traction  was then applied to the left lower extremity utilizing the [facility]  traction. The femoral head was then removed. Retractors were placed  around the acetabulum and then circumferential removal of the labrum was  performed. Osteophytes were also removed. Reaming starts at 49 mm to  medialize and  Increased in 2 mm increments to 51 mm. We reamed in  approximately 40 degrees of abduction, 20 degrees anteversion. A 52 mm  pinnacle acetabular shell was then impacted in anatomic position under  fluoroscopic guidance with excellent purchase. We did not need to place  any additional dome screws. A 32 mm neutral + 4 marathon liner was then  placed into the acetabular shell.       The femoral lift was then placed along the lateral aspect of the femur  just distal to the vastus ridge. The leg was  externally rotated and capsule  was stripped off the inferior aspect of the femoral neck down to the  level of the lesser trochanter, this was done with electrocautery. The femur was lifted after this was performed. The  leg was then placed in an extended and adducted position essentially delivering the femur. We also removed the capsule superiorly and the piriformis from the piriformis fossa to  gain excellent exposure of the  proximal femur. Rongeur was used to remove some cancellous bone to get  into the lateral portion of the proximal femur for placement of the  initial starter reamer. The starter broaches was placed  the starter broach  and was shown to go down the center of the canal. Broaching  with the Actis system was then performed starting at size 0  coursing  Up to size 7. A size 7 had excellent torsional and rotational  and axial stability. The trial standard offset neck was then  placed  with a 32 + 1 trial head. The hip was then reduced. We confirmed that  the stem was in the canal both on AP and lateral x-rays. It also has excellent sizing. The hip was reduced with outstanding stability through full extension and full external rotation.. AP pelvis was taken and the leg lengths were measured and found to be equal. Hip was then dislocated again and the femoral head and neck removed. The  femoral broach was removed. Size 7 Actis stem with a standard offset  neck was then impacted into the femur following native anteversion. Has  excellent purchase in the canal. Excellent torsional and rotational and  axial stability. It is confirmed to be in the canal on AP and lateral  fluoroscopic views. The 32 + 1 metal head was placed and the hip  reduced with outstanding stability. Again AP pelvis was taken and it  confirmed that the leg lengths were equal. The wound was then copiously  irrigated with saline solution and the capsule reattached and repaired  with Ethibond suture. 30 ml of .25% Bupivicaine was  injected into the capsule and into the edge of the tensor fascia lata as well as subcutaneous tissue. The fascia overlying the tensor fascia lata was then closed with a running #1 V-Loc. Subcu was closed with interrupted 2-0 Vicryl and subcuticular running 4-0 Monocryl. Incision was cleaned  and dried. Steri-Strips and a bulky sterile dressing applied. The patient was awakened and transported to  recovery in stable condition.        Please note that a surgical assistant was a medical necessity for this procedure to perform it in a safe and expeditious manner. Assistant was necessary to provide appropriate retraction of vital neurovascular structures and to prevent femoral fracture and allow for anatomic placement of the prosthesis.  Gaynelle Arabian, M.D.

## 2022-09-07 NOTE — Transfer of Care (Signed)
Immediate Anesthesia Transfer of Care Note  Patient: Theresa Pierce  Procedure(s) Performed: TOTAL HIP ARTHROPLASTY ANTERIOR APPROACH (Right: Hip)  Patient Location: PACU  Anesthesia Type:Spinal  Level of Consciousness: awake, alert , and oriented  Airway & Oxygen Therapy: Patient Spontanous Breathing and Patient connected to face mask oxygen  Post-op Assessment: Report given to RN and Post -op Vital signs reviewed and stable  Post vital signs: Reviewed and stable  Last Vitals:  Vitals Value Taken Time  BP 135/67 09/07/22 1305  Temp    Pulse 72 09/07/22 1307  Resp 25 09/07/22 1307  SpO2 91 % 09/07/22 1307  Vitals shown include unvalidated device data.  Last Pain:  Vitals:   09/07/22 0918  TempSrc:   PainSc: 0-No pain         Complications: No notable events documented.

## 2022-09-07 NOTE — Discharge Instructions (Signed)
°Theresa Aluisio, MD °Total Joint Specialist °[facility] °[address] °[place], [place] [address] °[phone] ° °ANTERIOR APPROACH TOTAL HIP REPLACEMENT POSTOPERATIVE DIRECTIONS ° ° ° ° °Hip Rehabilitation, Guidelines Following Surgery  °The results of a hip operation are greatly improved after range of motion and muscle strengthening exercises. Follow all safety measures which are given to protect your hip. If any of these exercises cause increased pain or swelling in your joint, decrease the amount until you are comfortable again. Then slowly increase the exercises. Call your caregiver if you have problems or questions.  ° °BLOOD CLOT PREVENTION °Take a 325 mg Aspirin two times a day for three weeks following surgery. Then take an 81 mg Aspirin once a day for three weeks. Then discontinue Aspirin. °You may resume your vitamins/supplements upon discharge from the hospital. °Do not take any NSAIDs (Advil, Aleve, Ibuprofen, Meloxicam, etc.) until you have discontinued the 325 mg Aspirin. ° °HOME CARE INSTRUCTIONS  °Remove items at home which could result in a fall. This includes throw rugs or furniture in walking pathways.  °ICE to the affected hip as frequently as 20-30 minutes an hour and then as needed for pain and swelling. Continue to use ice on the hip for pain and swelling from surgery. You may notice swelling that will progress down to the foot and ankle. This is normal after surgery. Elevate the leg when you are not up walking on it.   °Continue to use the breathing machine which will help keep your temperature down.  It is common for your temperature to cycle up and down following surgery, especially at night when you are not up moving around and exerting yourself.  The breathing machine keeps your lungs expanded and your temperature down. ° °DIET °You may resume your previous home diet once your are discharged from the hospital. ° °DRESSING / WOUND CARE / SHOWERING °You have  an adhesive waterproof bandage over the incision. Leave this in place until your first follow-up appointment. Once you remove this you will not need to place another bandage.  °You may begin showering 3 days following surgery, but do not submerge the incision under water. ° °ACTIVITY °For the first 3-5 days, it is important to rest and keep the operative leg elevated. You should, as a general rule, rest for 50 minutes and walk/stretch for 10 minutes per hour. After 5 days, you may slowly increase activity as tolerated.  °Perform the exercises you were provided twice a day for about 15-20 minutes each session. Begin these 2 days following surgery. °Walk with your walker as instructed. Use the walker until you are comfortable transitioning to a cane. Walk with the cane in the opposite hand of the operative leg. You may discontinue the cane once you are comfortable and walking steadily. °Avoid periods of inactivity such as sitting longer than an hour when not asleep. This helps prevent blood clots.  °Do not drive a car for 6 weeks or until released by your surgeon.  °Do not drive while taking narcotics. ° °TED HOSE STOCKINGS °Wear the elastic stockings on both legs for three weeks following surgery during the day. You may remove them at night while sleeping. ° °WEIGHT BEARING °Weight bearing as tolerated with assist device (walker, cane, etc) as directed, use it as long as suggested by your surgeon or therapist, typically at least 4-6 weeks. ° °POSTOPERATIVE CONSTIPATION PROTOCOL °Constipation - defined medically as fewer than three stools per week and severe constipation as   less than one stool per week. ° °One of the most common issues patients have following surgery is constipation.  Even if you have a regular bowel pattern at home, your normal regimen is likely to be disrupted due to multiple reasons following surgery.  Combination of anesthesia, postoperative narcotics, change in appetite and fluid intake all can  affect your bowels.  In order to avoid complications following surgery, here are some recommendations in order to help you during your recovery period. ° °Colace (docusate) - Pick up an over-the-counter form of Colace or another stool softener and take twice a day as long as you are requiring postoperative pain medications.  Take with a full glass of water daily.  If you experience loose stools or diarrhea, hold the colace until you stool forms back up.  If your symptoms do not get better within 1 week or if they get worse, check with your doctor. °Dulcolax (bisacodyl) - Pick up over-the-counter and take as directed by the product packaging as needed to assist with the movement of your bowels.  Take with a full glass of water.  Use this product as needed if not relieved by Colace only.  °MiraLax (polyethylene glycol) - Pick up over-the-counter to have on hand.  MiraLax is a solution that will increase the amount of water in your bowels to assist with bowel movements.  Take as directed and can mix with a glass of water, juice, soda, coffee, or tea.  Take if you go more than two days without a movement.Do not use MiraLax more than once per day. Call your doctor if you are still constipated or irregular after using this medication for 7 days in a row. ° °If you continue to have problems with postoperative constipation, please contact the office for further assistance and recommendations.  If you experience "the worst abdominal pain ever" or develop nausea or vomiting, please contact the office immediatly for further recommendations for treatment. ° °ITCHING ° If you experience itching with your medications, try taking only a single pain pill, or even half a pain pill at a time.  You can also use Benadryl over the counter for itching or also to help with sleep.  ° °MEDICATIONS °See your medication summary on the “After Visit Summary” that the nursing staff will review with you prior to discharge.  You may have some home  medications which will be placed on hold until you complete the course of blood thinner medication.  It is important for you to complete the blood thinner medication as prescribed by your surgeon.  Continue your approved medications as instructed at time of discharge. ° °PRECAUTIONS °If you experience chest pain or shortness of breath - call 911 immediately for transfer to the hospital emergency department.  °If you develop a fever greater that 101 F, purulent drainage from wound, increased redness or drainage from wound, foul odor from the wound/dressing, or calf pain - CONTACT YOUR SURGEON.   °                                                °FOLLOW-UP APPOINTMENTS °Make sure you keep all of your appointments after your operation with your surgeon and caregivers. You should call the office at the above phone number and make an appointment for approximately two weeks after the date of your surgery or on the   date instructed by your surgeon outlined in the "After Visit Summary". ° °RANGE OF MOTION AND STRENGTHENING EXERCISES  °These exercises are designed to help you keep full movement of your hip joint. Follow your caregiver's or physical therapist's instructions. Perform all exercises about fifteen times, three times per day or as directed. Exercise both hips, even if you have had only one joint replacement. These exercises can be done on a training (exercise) mat, on the floor, on a table or on a bed. Use whatever works the best and is most comfortable for you. Use music or television while you are exercising so that the exercises are a pleasant break in your day. This will make your life better with the exercises acting as a break in routine you can look forward to.  °Lying on your back, slowly slide your foot toward your buttocks, raising your knee up off the floor. Then slowly slide your foot back down until your leg is straight again.  °Lying on your back spread your legs as far apart as you can without causing  discomfort.  °Lying on your side, raise your upper leg and foot straight up from the floor as far as is comfortable. Slowly lower the leg and repeat.  °Lying on your back, tighten up the muscle in the front of your thigh (quadriceps muscles). You can do this by keeping your leg straight and trying to raise your heel off the floor. This helps strengthen the largest muscle supporting your knee.  °Lying on your back, tighten up the muscles of your buttocks both with the legs straight and with the knee bent at a comfortable angle while keeping your heel on the floor.  ° °POST-OPERATIVE OPIOID TAPER INSTRUCTIONS: °It is important to wean off of your opioid medication as soon as possible. If you do not need pain medication after your surgery it is ok to stop day one. °Opioids include: °Codeine, Hydrocodone(Norco, Vicodin), Oxycodone(Percocet, oxycontin) and hydromorphone amongst others.  °Long term and even short term use of opiods can cause: °Increased pain response °Dependence °Constipation °Depression °Respiratory depression °And more.  °Withdrawal symptoms can include °Flu like symptoms °Nausea, vomiting °And more °Techniques to manage these symptoms °Hydrate well °Eat regular healthy meals °Stay active °Use relaxation techniques(deep breathing, meditating, yoga) °Do Not substitute Alcohol to help with tapering °If you have been on opioids for less than two weeks and do not have pain than it is ok to stop all together.  °Plan to wean off of opioids °This plan should start within one week post op of your joint replacement. °Maintain the same interval or time between taking each dose and first decrease the dose.  °Cut the total daily intake of opioids by one tablet each day °Next start to increase the time between doses. °The last dose that should be eliminated is the evening dose.  ° °IF YOU ARE TRANSFERRED TO A SKILLED REHAB FACILITY °If the patient is transferred to a skilled rehab facility following release from the  hospital, a list of the current medications will be sent to the facility for the patient to continue.  When discharged from the skilled rehab facility, please have the facility set up the patient's Home Health Physical Therapy prior to being released. Also, the skilled facility will be responsible for providing the patient with their medications at time of release from the facility to include their pain medication, the muscle relaxants, and their blood thinner medication. If the patient is still at the rehab facility   at time of the two week follow up appointment, the skilled rehab facility will also need to assist the patient in arranging follow up appointment in our office and any transportation needs. ° °MAKE SURE YOU:  °Understand these instructions.  °Get help right away if you are not doing well or get worse.  ° ° °DENTAL ANTIBIOTICS: ° °In most cases prophylactic antibiotics for Dental procdeures after total joint surgery are not necessary. ° °Exceptions are as follows: ° °1. History of prior total joint infection ° °2. Severely immunocompromised (Organ Transplant, cancer chemotherapy, Rheumatoid biologic °meds such as Humera) ° °3. Poorly controlled diabetes (A1C &gt; 8.0, blood glucose over 200) ° °If you have one of these conditions, contact your surgeon for an antibiotic prescription, prior to your °dental procedure.  ° ° °Pick up stool softner and laxative for home use following surgery while on pain medications. °Do not submerge incision under water. °Please use good hand washing techniques while changing dressing each day. °May shower starting three days after surgery. °Please use a clean towel to pat the incision dry following showers. °Continue to use ice for pain and swelling after surgery. °Do not use any lotions or creams on the incision until instructed by your surgeon. ° °

## 2022-09-07 NOTE — Interval H&P Note (Signed)
History and Physical Interval Note:  09/07/2022 10:15 AM  Theresa Pierce  has presented today for surgery, with the diagnosis of right hip osteoarthritis.  The various methods of treatment have been discussed with the patient and family. After consideration of risks, benefits and other options for treatment, the patient has consented to  Procedure(s): TOTAL HIP ARTHROPLASTY ANTERIOR APPROACH (Right) as a surgical intervention.  The patient's history has been reviewed, patient examined, no change in status, stable for surgery.  I have reviewed the patient's chart and labs.  Questions were answered to the patient's satisfaction.     Pilar Plate Jaclin Finks

## 2022-09-07 NOTE — Anesthesia Preprocedure Evaluation (Addendum)
Anesthesia Evaluation  Patient identified by MRN, date of birth, ID band Patient awake    Reviewed: Allergy & Precautions, NPO status , Patient's Chart, lab work & pertinent test results  History of Anesthesia Complications Negative for: history of anesthetic complications  Airway Mallampati: II  TM Distance: >3 FB Neck ROM: Full    Dental  (+) Missing, Chipped, Dental Advisory Given,    Pulmonary neg pulmonary ROS   breath sounds clear to auscultation       Cardiovascular (-) hypertension(-) angina + Peripheral Vascular Disease  (-) Past MI and (-) CHF  Rhythm:Regular     Neuro/Psych negative neurological ROS  negative psych ROS   GI/Hepatic Neg liver ROS,GERD  ,,  Endo/Other  negative endocrine ROS    Renal/GU negative Renal ROS     Musculoskeletal  (+) Arthritis ,    Abdominal   Peds  Hematology negative hematology ROS (+) Lab Results      Component                Value               Date                      WBC                      6.8                 09/02/2022                HGB                      13.9                09/02/2022                HCT                      42.5                09/02/2022                MCV                      96.6                09/02/2022                PLT                      175                 09/02/2022              Anesthesia Other Findings   Reproductive/Obstetrics                             Anesthesia Physical Anesthesia Plan  ASA: 2  Anesthesia Plan: MAC and Spinal   Post-op Pain Management: Ofirmev IV (intra-op)*   Induction: Intravenous  PONV Risk Score and Plan: 2 and Propofol infusion and Treatment may vary due to age or medical condition  Airway Management Planned: Nasal Cannula and Natural Airway  Additional Equipment: None  Intra-op Plan:   Post-operative Plan:   Informed Consent: I have reviewed the patients History  and Physical,  chart, labs and discussed the procedure including the risks, benefits and alternatives for the proposed anesthesia with the patient or authorized representative who has indicated his/her understanding and acceptance.     Dental advisory given  Plan Discussed with: CRNA  Anesthesia Plan Comments:        Anesthesia Quick Evaluation

## 2022-09-07 NOTE — Care Plan (Signed)
Ortho Bundle Case Management Note  Patient Details  Name: Theresa Pierce MRN: [id number] Date of Birth: Jul 17, 1946                  R THA on 09/07/22. DCP: Home with husband. DME: No needs. Has RW. PT: [facility]   DME Arranged:  N/A DME Agency:     HH Arranged:    [facility] Agency:     Additional Comments: Please contact me with any questions of if this plan should need to change.  Dario Ave, Case Manager  [facility] [phone] 09/07/2022, 3:49 PM

## 2022-09-07 NOTE — Plan of Care (Signed)

## 2022-09-07 NOTE — Anesthesia Procedure Notes (Signed)
Spinal  Patient location during procedure: OR Start time: 09/07/2022 11:24 AM End time: 09/07/2022 11:30 AM Reason for block: surgical anesthesia Staffing Performed: anesthesiologist  Anesthesiologist: Oleta Mouse, MD Performed by: Oleta Mouse, MD Authorized by: Oleta Mouse, MD   Preanesthetic Checklist Completed: patient identified, IV checked, risks and benefits discussed, surgical consent, monitors and equipment checked, pre-op evaluation and timeout performed Spinal Block Patient position: sitting Prep: DuraPrep Patient monitoring: heart rate, cardiac monitor, continuous pulse ox and blood pressure Approach: midline Location: L3-4 Injection technique: single-shot Needle Needle type: Pencan  Needle gauge: 24 G Needle length: 9 cm Assessment Sensory level: T6 Events: CSF return

## 2022-09-07 NOTE — Evaluation (Signed)
Physical Therapy Evaluation Patient Details Name: Theresa Pierce MRN: [id number] DOB: June 13, 1946 Today's Date: 09/07/2022  History of Present Illness  Pt is a 76yo female presenting s/p R-THAAA on 09/07/22. PMH: GERD, PVD, hx of L ankle fx with reduced sensation.  Clinical Impression  Theresa Pierce is a 76 y.o. female POD 0 s/p R-THA, AA. Patient reports modified independence using SPC with mobility at baseline. Patient is now limited by functional impairments (see PT problem list below) and requires mod assist for bed mobility and min-mod assist for transfers; pt required max encouragement and increased time with grimacing and groaning in pain throughout, two standing rest breaks during step pivot transfer with pt requesting to "just flop down" prior to arrival at recliner. Patient instructed in exercise to facilitate ROM and circulation to manage edema.  Patient will benefit from continued skilled PT interventions to address impairments and progress towards PLOF. Acute PT will follow to progress mobility and stair training in preparation for safe discharge.  Of Note: At end of session, pt reporting: "I can't go home like this, my husband is 61, he can't help me if this is how it's going to be;" pt reporting there is a granddaughter who can help and her son lives next door. Discussed possibility of SNF-level therapies upon discharge and pt was not amenable. Would appreciate MD and TOC input given pt's presentation on evaluation. Thank you.      Recommendations for follow up therapy are one component of a multi-disciplinary discharge planning process, led by the attending physician.  Recommendations may be updated based on patient status, additional functional criteria and insurance authorization.  Follow Up Recommendations Follow physician's recommendations for discharge plan and follow up therapies (Pt reports family cannot provide physical assist but also does not want to go to a SNF.)       Assistance Recommended at Discharge Frequent or constant Supervision/Assistance  Patient can return home with the following  Help with stairs or ramp for entrance;Assist for transportation;Assistance with cooking/housework;A little help with bathing/dressing/bathroom;A little help with walking and/or transfers    Equipment Recommendations None recommended by PT  Recommendations for Other Services       Functional Status Assessment Patient has had a recent decline in their functional status and demonstrates the ability to make significant improvements in function in a reasonable and predictable amount of time.     Precautions / Restrictions Precautions Precautions: Fall Restrictions Weight Bearing Restrictions: Yes RLE Weight Bearing: Weight bearing as tolerated      Mobility  Bed Mobility Overal bed mobility: Needs Assistance Bed Mobility: Supine to Sit     Supine to sit: Mod assist, HOB elevated     General bed mobility comments: Mod assist for trunk elevation, bringing RLE off bed, scooting hips EOB    Transfers Overall transfer level: Needs assistance Equipment used: Rolling walker (2 wheels) Transfers: Sit to/from Stand, Bed to chair/wheelchair/BSC Sit to Stand: From elevated surface, Mod assist   Step pivot transfers: Min assist       General transfer comment: Sit to stand: mod assist for lift assist and bringing weight forward. Step pivot: min assist for AD management with max encouragement, pt with heavy use of BUE and resting twice in standing during transfer. Increased time. Pt grimacing and groaning throughout, was not able to stand upright, sliding L foot across floor.     Ambulation/Gait               General Gait Details:  deferred  Stairs            Wheelchair Mobility    Modified Rankin (Stroke Patients Only)       Balance Overall balance assessment: Needs assistance Sitting-balance support: Feet supported, No upper extremity  supported Sitting balance-Leahy Scale: Good     Standing balance support: Reliant on assistive device for balance, During functional activity, Bilateral upper extremity supported Standing balance-Leahy Scale: Poor Standing balance comment: Pt with strong anterior trunk flexion, did not stand upright during session                             Pertinent Vitals/Pain Pain Assessment Pain Assessment: 0-10 Pain Score: 7  Pain Location: right hip Pain Descriptors / Indicators: Operative site guarding, Crying, Discomfort, Grimacing, Guarding Pain Intervention(s): Limited activity within patient's tolerance, Monitored during session, Repositioned, Ice applied    Home Living Family/patient expects to be discharged to:: Private residence Living Arrangements: Spouse/significant other Available Help at Discharge: Family;Available PRN/intermittently (Son lives next door, husband can provide supervision but not assistance. Granddaughter may be a possibility) Type of Home: House Home Access: Stairs to enter   [employer] of Steps: 1 3" treshold (off carport)   Home Layout: One level Home Equipment: Conservation officer, nature (2 wheels);Cane - single point;Shower seat      Prior Function Prior Level of Function : Independent/Modified Independent             Mobility Comments: SPC at all times ADLs Comments: IND with exception of donning/doffing R shoe and sock     Hand Dominance        Extremity/Trunk Assessment   Upper Extremity Assessment Upper Extremity Assessment: Generalized weakness    Lower Extremity Assessment Lower Extremity Assessment: RLE deficits/detail;LLE deficits/detail RLE Deficits / Details: MMT ank DF/PF 4/5 RLE Sensation: WNL LLE Deficits / Details: MMT ank DF/PF 4/5 LLE Sensation: decreased light touch (History of reduced sensation)    Cervical / Trunk Assessment Cervical / Trunk Assessment: Normal  Communication   Communication: No  difficulties  Cognition Arousal/Alertness: Awake/alert Behavior During Therapy: WFL for tasks assessed/performed Overall Cognitive Status: Within Functional Limits for tasks assessed                                          General Comments      Exercises Total Joint Exercises Ankle Circles/Pumps: AROM, Both, 20 reps   Assessment/Plan    PT Assessment Patient needs continued PT services  PT Problem List Decreased strength;Decreased range of motion;Decreased activity tolerance;Decreased balance;Decreased mobility;Decreased coordination;Pain       PT Treatment Interventions DME instruction;Gait training;Stair training;Functional mobility training;Therapeutic activities;Therapeutic exercise;Balance training;Neuromuscular re-education;Patient/family education    PT Goals (Current goals can be found in the Care Plan section)  Acute Rehab PT Goals Patient Stated Goal: Walk better PT Goal Formulation: With patient Time For Goal Achievement: 09/14/22 Potential to Achieve Goals: Good    Frequency 7X/week     Co-evaluation               AM-PAC PT "6 Clicks" Mobility  Outcome Measure Help needed turning from your back to your side while in a flat bed without using bedrails?: A Little Help needed moving from lying on your back to sitting on the side of a flat bed without using bedrails?: A Lot Help needed moving to  and from a bed to a chair (including a wheelchair)?: A Little Help needed standing up from a chair using your arms (e.g., wheelchair or bedside chair)?: A Little Help needed to walk in hospital room?: A Lot Help needed climbing 3-5 steps with a railing? : A Lot 6 Click Score: 15    End of Session Equipment Utilized During Treatment: Gait belt Activity Tolerance: Patient limited by pain Patient left: in chair;with call bell/phone within reach;with chair alarm set Nurse Communication: Mobility status PT Visit Diagnosis: Pain;Difficulty in  walking, not elsewhere classified (R26.2) Pain - Right/Left: Right Pain - part of body: Hip    Time: 7543-6067 PT Time Calculation (min) (ACUTE ONLY): 33 min   Charges:   PT Evaluation $PT Eval Low Complexity: 1 Low PT Treatments $Therapeutic Activity: 8-22 mins        Coolidge Breeze, PT, DPT [facility] Department Office: [phone] Weekend pager: [phone]  Coolidge Breeze 09/07/2022, 6:34 PM

## 2022-09-08 ENCOUNTER — Encounter (HOSPITAL_COMMUNITY): Payer: Self-pay | Admitting: Orthopedic Surgery

## 2022-09-08 DIAGNOSIS — M1611 Unilateral primary osteoarthritis, right hip: Secondary | ICD-10-CM | POA: Diagnosis present

## 2022-09-08 DIAGNOSIS — Z79899 Other long term (current) drug therapy: Secondary | ICD-10-CM | POA: Diagnosis not present

## 2022-09-08 DIAGNOSIS — Z880 Allergy status to penicillin: Secondary | ICD-10-CM | POA: Diagnosis not present

## 2022-09-08 DIAGNOSIS — Z885 Allergy status to narcotic agent status: Secondary | ICD-10-CM | POA: Diagnosis not present

## 2022-09-08 DIAGNOSIS — I739 Peripheral vascular disease, unspecified: Secondary | ICD-10-CM | POA: Diagnosis present

## 2022-09-08 DIAGNOSIS — K219 Gastro-esophageal reflux disease without esophagitis: Secondary | ICD-10-CM | POA: Diagnosis present

## 2022-09-08 LAB — CBC
HCT: 34.1 % — ABNORMAL LOW (ref 36.0–46.0)
Hemoglobin: 11.2 g/dL — ABNORMAL LOW (ref 12.0–15.0)
MCH: 31.8 pg (ref 26.0–34.0)
MCHC: 32.8 g/dL (ref 30.0–36.0)
MCV: 96.9 fL (ref 80.0–100.0)
Platelets: 141 10*3/uL — ABNORMAL LOW (ref 150–400)
RBC: 3.52 MIL/uL — ABNORMAL LOW (ref 3.87–5.11)
RDW: 12.3 % (ref 11.5–15.5)
WBC: 11.7 10*3/uL — ABNORMAL HIGH (ref 4.0–10.5)
nRBC: 0 % (ref 0.0–0.2)

## 2022-09-08 LAB — BASIC METABOLIC PANEL
Anion gap: 7 (ref 5–15)
BUN: 12 mg/dL (ref 8–23)
CO2: 27 mmol/L (ref 22–32)
Calcium: 8.7 mg/dL — ABNORMAL LOW (ref 8.9–10.3)
Chloride: 104 mmol/L (ref 98–111)
Creatinine, Ser: 0.74 mg/dL (ref 0.44–1.00)
GFR, Estimated: >60 mL/min (ref >60)
Glucose, Bld: 154 mg/dL — ABNORMAL HIGH (ref 70–99)
Potassium: 4.3 mmol/L (ref 3.5–5.1)
Sodium: 138 mmol/L (ref 135–145)

## 2022-09-08 NOTE — Progress Notes (Signed)
   Subjective: 1 Day Post-Op Procedure(s) (LRB): TOTAL HIP ARTHROPLASTY ANTERIOR APPROACH (Right) Patient seen in rounds by Dr. Wynelle Link. Patient is well, and has had no acute complaints or problems. Denies SOB or chest pain. Denies calf pain. Foley cath removed this AM. Patient reports pain as moderate. We will continue physical therapy today.   Objective: Vital signs in last 24 hours: Temp:  [97.5 F (36.4 C)-98.9 F (37.2 C)] 97.5 F (36.4 C) (11/16 0533) Pulse Rate:  [72-99] 84 (11/16 0533) Resp:  [12-25] 17 (11/16 0533) BP: (135-182)/(67-92) 139/83 (11/16 0533) SpO2:  [96 %-100 %] 100 % (11/16 0533) Weight:  [91.3 kg] 91.3 kg (11/15 0918)  Intake/Output from previous day:  Intake/Output Summary (Last 24 hours) at 09/08/2022 0718 Last data filed at 09/08/2022 5465 Gross per 24 hour  Intake 4006.54 ml  Output 2750 ml  Net 1256.54 ml     Intake/Output this shift: No intake/output data recorded.  Labs: Recent Labs    09/08/22 0315  HGB 11.2*   Recent Labs    09/08/22 0315  WBC 11.7*  RBC 3.52*  HCT 34.1*  PLT 141*   Recent Labs    09/08/22 0315  NA 138  K 4.3  CL 104  CO2 27  BUN 12  CREATININE 0.74  GLUCOSE 154*  CALCIUM 8.7*   No results for input(s): "LABPT", "INR" in the last 72 hours.  Exam: General - Patient is Alert and Oriented Extremity - Neurologically intact Neurovascular intact Sensation intact distally Dorsiflexion/Plantar flexion intact Dressing - dressing C/D/I Motor Function - intact, moving foot and toes well on exam.  Past Medical History:  Diagnosis Date   Arthritis    hips and knees   Broken ankle    numbness in foot   GERD (gastroesophageal reflux disease)    Peripheral vascular disease (HCC)     Assessment/Plan: 1 Day Post-Op Procedure(s) (LRB): TOTAL HIP ARTHROPLASTY ANTERIOR APPROACH (Right) Principal Problem:   OA (osteoarthritis) of hip Active Problems:   Osteoarthritis of right hip  Estimated body mass  index is 34 kg/m as calculated from the following:   Height as of this encounter: 5' 4.5" (1.638 m).   Weight as of this encounter: 91.3 kg. Advance diet Up with therapy D/C IV fluids  DVT Prophylaxis - Aspirin Weight bearing as tolerated. Continue physical therapy.  Plan is to go Home after hospital stay. Unfortunately, she has limited help at home so she will need to demonstrate good mobility and independence prior to discharge. We will continue with physical therapy today. Will require additional night(s) in hospital to maximize mobility. Plan for HEP once discharged.  Rainey Pines, PA-C Orthopedic Surgery [phone] 09/08/2022, 7:18 AM

## 2022-09-08 NOTE — Progress Notes (Signed)
Physical Therapy Treatment Patient Details Name: Theresa Pierce MRN: [id number] DOB: [birth year] Today's Date: 09/08/2022   History of Present Illness Pt is a 76yo female presenting s/p R-THAAA on 09/07/22. PMH: GERD, PVD, hx of L ankle fx with reduced sensation.    PT Comments    Pt ambulated 8' with RW with increased time/effort, distance limited by pain/fatigue, L wrist painful at IV site with weightbearing on RW limiting ambulation tolerance. Mod assist for bed mobility and sit to stand. Pt's spouse not able to provide physical assist at home, pt will need to be independent with mobility to DC home. She will not be ready to DC home today. Will plan to see her for a second session this afternoon.     Recommendations for follow up therapy are one component of a multi-disciplinary discharge planning process, led by the attending physician.  Recommendations may be updated based on patient status, additional functional criteria and insurance authorization.  Follow Up Recommendations  Follow physician's recommendations for discharge plan and follow up therapies (Pt reports family cannot provide physical assist but also does not want to go to a SNF.)     Assistance Recommended at Discharge Frequent or constant Supervision/Assistance  Patient can return home with the following Help with stairs or ramp for entrance;Assist for transportation;Assistance with cooking/housework;A little help with bathing/dressing/bathroom;A little help with walking and/or transfers   Equipment Recommendations  None recommended by PT    Recommendations for Other Services       Precautions / Restrictions Precautions Precautions: Fall Restrictions Weight Bearing Restrictions: Yes RLE Weight Bearing: Weight bearing as tolerated     Mobility  Bed Mobility Overal bed mobility: Needs Assistance Bed Mobility: Supine to Sit     Supine to sit: Mod assist, HOB elevated     General bed mobility comments: Mod  assist for trunk elevation, bringing RLE off bed, scooting hips EOB, increased time    Transfers Overall transfer level: Needs assistance Equipment used: Rolling walker (2 wheels) Transfers: Sit to/from Stand, Bed to chair/wheelchair/BSC Sit to Stand: From elevated surface, Mod assist   Step pivot transfers: Min assist       General transfer comment: Sit to stand: mod assist for lift assist and bringing weight forward from elevated bed. Step pivot: min assist for AD management with max encouragement, pt with heavy use of BUE and resting twice in standing during transfer. IV site in L wrist very painful with weightbearing on RW.  Increased time.    Ambulation/Gait Ambulation/Gait assistance: Min guard Gait Distance (Feet): 8 Feet Assistive device: Rolling walker (2 wheels) Gait Pattern/deviations: Step-to pattern Gait velocity: decr     General Gait Details: increased time, difficulty WBing on RLE 2* pain in L wrist with use of RW due to IV in wrist, distance limited by pain and fatigue   Stairs             Wheelchair Mobility    Modified Rankin (Stroke Patients Only)       Balance Overall balance assessment: Needs assistance Sitting-balance support: Feet supported, No upper extremity supported Sitting balance-Leahy Scale: Good     Standing balance support: Reliant on assistive device for balance, During functional activity, Bilateral upper extremity supported Standing balance-Leahy Scale: Poor                              Cognition Arousal/Alertness: Awake/alert Behavior During Therapy: WFL for tasks assessed/performed Overall Cognitive  Status: Within Functional Limits for tasks assessed                                          Exercises Total Joint Exercises Ankle Circles/Pumps: AROM, Both, 15 reps, Supine Heel Slides: AAROM, Right, 10 reps, Supine Hip ABduction/ADduction: AAROM, Right, 10 reps, Supine    General Comments         Pertinent Vitals/Pain Pain Assessment Pain Score: 7  Pain Location: right hip Pain Descriptors / Indicators: Operative site guarding, Crying, Discomfort, Grimacing, Guarding Pain Intervention(s): Limited activity within patient's tolerance, Monitored during session, Premedicated before session, Ice applied    Home Living                          Prior Function            PT Goals (current goals can now be found in the care plan section) Acute Rehab PT Goals Patient Stated Goal: move well enough to go home (spouse cannot provide physical assist) PT Goal Formulation: With patient Time For Goal Achievement: 09/14/22 Potential to Achieve Goals: Good Progress towards PT goals: Progressing toward goals    Frequency    7X/week      PT Plan Current plan remains appropriate    Co-evaluation              AM-PAC PT "6 Clicks" Mobility   Outcome Measure  Help needed turning from your back to your side while in a flat bed without using bedrails?: A Little Help needed moving from lying on your back to sitting on the side of a flat bed without using bedrails?: A Lot Help needed moving to and from a bed to a chair (including a wheelchair)?: A Little Help needed standing up from a chair using your arms (e.g., wheelchair or bedside chair)?: A Lot Help needed to walk in hospital room?: A Lot Help needed climbing 3-5 steps with a railing? : A Lot 6 Click Score: 14    End of Session Equipment Utilized During Treatment: Gait belt Activity Tolerance: Patient limited by pain Patient left: in chair;with call bell/phone within reach;with chair alarm set Nurse Communication: Mobility status PT Visit Diagnosis: Pain;Difficulty in walking, not elsewhere classified (R26.2) Pain - Right/Left: Right Pain - part of body: Hip     Time: 7494-4967 PT Time Calculation (min) (ACUTE ONLY): 34 min  Charges:  $Gait Training: 8-22 mins $Therapeutic Exercise: 8-22 mins                      Theresa Pierce PT 09/08/2022  Acute Rehabilitation Services  Office [phone]

## 2022-09-08 NOTE — TOC Progression Note (Signed)
Transition of Care [facility]) - Progression Note    Patient Details  Name: DWAN FENNEL MRN: [id number] Date of Birth: 06-07-1946  Transition of [facility]) CM/SW Contact  Lennart Pall, LCSW Phone Number: 09/08/2022, 12:34 PM  Clinical Narrative:     Met with pt today to review possible dc needs.  She confirms that her plan, prior to surgery, was home with HEP and notes she has all needed DME at home.  She is concerned about her limited mobility currently noting that she only has her elderly spouse in the home with her.  Son is living next door but is working.  At this point, CSW will monitor for any new TOC needs if dc plan were to change.    Barriers to Discharge: Continued Medical Work up  Expected Discharge Plan and Services                           DME Arranged: N/A                     Social Determinants of Health (SDOH) Interventions    Readmission Risk Interventions     No data to display

## 2022-09-08 NOTE — Progress Notes (Signed)
Pt is in bed eating dinner at this time.  Has no complaints and denies needing pain medication at this time.  Call bell is within reach.  Care continues.

## 2022-09-08 NOTE — Progress Notes (Signed)
Physical Therapy Treatment Patient Details Name: Theresa Pierce MRN: [id number] DOB: 1946/05/07 Today's Date: 09/08/2022   History of Present Illness Pt is a 76yo female presenting s/p R-THAAA on 09/07/22. PMH: GERD, PVD, hx of L ankle fx with reduced sensation.    PT Comments    Pt is progressing with mobility, she ambulated 23' with a RW, no loss of balance. IV was removed from R wrist and pt was better able to weight bear thru UEs with RW. Pt performed THA exercises with min assist.    Recommendations for follow up therapy are one component of a multi-disciplinary discharge planning process, led by the attending physician.  Recommendations may be updated based on patient status, additional functional criteria and insurance authorization.  Follow Up Recommendations  Follow physician's recommendations for discharge plan and follow up therapies (Pt reports family cannot provide physical assist but also does not want to go to a SNF.)     Assistance Recommended at Discharge Frequent or constant Supervision/Assistance  Patient can return home with the following Help with stairs or ramp for entrance;Assist for transportation;Assistance with cooking/housework;A little help with bathing/dressing/bathroom;A little help with walking and/or transfers   Equipment Recommendations  None recommended by PT    Recommendations for Other Services       Precautions / Restrictions Precautions Precautions: Fall Restrictions Weight Bearing Restrictions: Yes RLE Weight Bearing: Weight bearing as tolerated     Mobility  Bed Mobility Overal bed mobility: Needs Assistance Bed Mobility: Supine to Sit     Supine to sit: Mod assist, HOB elevated     General bed mobility comments: up in recliner    Transfers Overall transfer level: Needs assistance Equipment used: Rolling walker (2 wheels) Transfers: Sit to/from Stand, Bed to chair/wheelchair/BSC Sit to Stand: Mod assist   Step pivot  transfers: Min assist       General transfer comment: Sit to stand: mod assist to power up. VCs hand placement. Pt used momentum.  Increased time.    Ambulation/Gait Ambulation/Gait assistance: Min guard Gait Distance (Feet): 30 Feet Assistive device: Rolling walker (2 wheels) Gait Pattern/deviations: Step-to pattern Gait velocity: decr     General Gait Details: increased time, distance limited by pain and fatigue   Stairs             Wheelchair Mobility    Modified Rankin (Stroke Patients Only)       Balance Overall balance assessment: Needs assistance Sitting-balance support: Feet supported, No upper extremity supported Sitting balance-Leahy Scale: Good     Standing balance support: Reliant on assistive device for balance, During functional activity, Bilateral upper extremity supported Standing balance-Leahy Scale: Poor                              Cognition Arousal/Alertness: Awake/alert Behavior During Therapy: WFL for tasks assessed/performed Overall Cognitive Status: Within Functional Limits for tasks assessed                                          Exercises Total Joint Exercises Ankle Circles/Pumps: AROM, Both, 15 reps, Supine Short Arc Quad: AROM, Right, 10 reps, Supine Heel Slides: AAROM, Right, 10 reps, Supine Hip ABduction/ADduction: AAROM, Right, 10 reps, Supine    General Comments        Pertinent Vitals/Pain Pain Assessment Pain Score: 7  Pain Location:  right hip Pain Descriptors / Indicators: Operative site guarding, Discomfort, Grimacing Pain Intervention(s): Limited activity within patient's tolerance, Monitored during session, Premedicated before session, Ice applied    Home Living                          Prior Function            PT Goals (current goals can now be found in the care plan section) Acute Rehab PT Goals Patient Stated Goal: move well enough to go home (spouse cannot  provide physical assist) PT Goal Formulation: With patient Time For Goal Achievement: 09/14/22 Potential to Achieve Goals: Good Progress towards PT goals: Progressing toward goals    Frequency    7X/week      PT Plan Current plan remains appropriate    Co-evaluation              AM-PAC PT "6 Clicks" Mobility   Outcome Measure  Help needed turning from your back to your side while in a flat bed without using bedrails?: A Little Help needed moving from lying on your back to sitting on the side of a flat bed without using bedrails?: A Lot Help needed moving to and from a bed to a chair (including a wheelchair)?: A Little Help needed standing up from a chair using your arms (e.g., wheelchair or bedside chair)?: A Lot Help needed to walk in hospital room?: A Little Help needed climbing 3-5 steps with a railing? : A Lot 6 Click Score: 15    End of Session Equipment Utilized During Treatment: Gait belt Activity Tolerance: Patient limited by pain Patient left: in chair;with call bell/phone within reach;with chair alarm set Nurse Communication: Mobility status PT Visit Diagnosis: Pain;Difficulty in walking, not elsewhere classified (R26.2) Pain - Right/Left: Right Pain - part of body: Hip     Time: 1242-1309 PT Time Calculation (min) (ACUTE ONLY): 27 min  Charges:  $Gait Training: 8-22 mins $Therapeutic Exercise: 8-22 mins                     Blondell Reveal Kistler PT 09/08/2022  Acute Rehabilitation Services  Office [phone]

## 2022-09-08 NOTE — Plan of Care (Signed)
  Problem: Education: Goal: Knowledge of the prescribed therapeutic regimen will improve Outcome: Progressing   Problem: Activity: Goal: Ability to tolerate increased activity will improve Outcome: Progressing   Problem: Pain Management: Goal: Pain level will decrease with appropriate interventions Outcome: Progressing   Problem: Safety: Goal: Ability to remain free from injury will improve Outcome: Progressing

## 2022-09-08 NOTE — Plan of Care (Signed)

## 2022-09-09 LAB — CBC
HCT: 31.4 % — ABNORMAL LOW (ref 36.0–46.0)
Hemoglobin: 10.2 g/dL — ABNORMAL LOW (ref 12.0–15.0)
MCH: 31.4 pg (ref 26.0–34.0)
MCHC: 32.5 g/dL (ref 30.0–36.0)
MCV: 96.6 fL (ref 80.0–100.0)
Platelets: 132 10*3/uL — ABNORMAL LOW (ref 150–400)
RBC: 3.25 MIL/uL — ABNORMAL LOW (ref 3.87–5.11)
RDW: 12.4 % (ref 11.5–15.5)
WBC: 11.8 10*3/uL — ABNORMAL HIGH (ref 4.0–10.5)
nRBC: 0 % (ref 0.0–0.2)

## 2022-09-09 MED ORDER — HYDROCODONE-ACETAMINOPHEN 5-325 MG PO TABS: 1.0000 | ORAL_TABLET | Freq: Four times a day (QID) | ORAL | 0 refills | Status: DC | PRN

## 2022-09-09 MED ORDER — TRAMADOL HCL 50 MG PO TABS: 50.0000 mg | ORAL_TABLET | Freq: Four times a day (QID) | ORAL | 0 refills | Status: DC | PRN

## 2022-09-09 MED ORDER — ASPIRIN 325 MG PO TBEC: 325.0000 mg | DELAYED_RELEASE_TABLET | Freq: Two times a day (BID) | ORAL | 0 refills | Status: AC

## 2022-09-09 MED ORDER — METHOCARBAMOL 500 MG PO TABS: 500.0000 mg | ORAL_TABLET | Freq: Four times a day (QID) | ORAL | 0 refills | Status: DC | PRN

## 2022-09-09 NOTE — Progress Notes (Signed)
Physical Therapy Treatment Patient Details Name: Theresa Pierce MRN: [id number] DOB: May 08, 1946 Today's Date: 09/09/2022   History of Present Illness Pt is a 76yo female presenting s/p R-THAAA on 09/07/22. PMH: GERD, PVD, hx of L ankle fx with reduced sensation.    PT Comments    Pt tolerated increased ambulation distance of 37' with RW. All movement is slow and labored, especially sit to stand even from an elevated surface. She would benefit from one more night in the hospital to progress mobility to level she can function safely at home.     Recommendations for follow up therapy are one component of a multi-disciplinary discharge planning process, led by the attending physician.  Recommendations may be updated based on patient status, additional functional criteria and insurance authorization.  Follow Up Recommendations  Follow physician's recommendations for discharge plan and follow up therapies     Assistance Recommended at Discharge Frequent or constant Supervision/Assistance  Patient can return home with the following Help with stairs or ramp for entrance;Assist for transportation;Assistance with cooking/housework;A little help with bathing/dressing/bathroom;A little help with walking and/or transfers   Equipment Recommendations  None recommended by PT    Recommendations for Other Services       Precautions / Restrictions Precautions Precautions: Fall Restrictions Weight Bearing Restrictions: No RLE Weight Bearing: Weight bearing as tolerated     Mobility  Bed Mobility   Bed Mobility: Sit to Supine       Sit to supine: Min assist   General bed mobility comments: min A for LEs into bed    Transfers Overall transfer level: Needs assistance Equipment used: Rolling walker (2 wheels) Transfers: Sit to/from Stand Sit to Stand: Min assist, From elevated surface           General transfer comment: Sit to stand: min assist to power up. VCs hand placement. Pt  used momentum.  Increased time, multiple attempts required before pt able to come to full stand from built up recliner, and from bedside commode.    Ambulation/Gait Ambulation/Gait assistance: Min guard Gait Distance (Feet): 70 Feet Assistive device: Rolling walker (2 wheels) Gait Pattern/deviations: Step-to pattern Gait velocity: decr     General Gait Details: increased time, distance limited by pain and fatigue, VCs to lift head   Stairs             Wheelchair Mobility    Modified Rankin (Stroke Patients Only)       Balance Overall balance assessment: Needs assistance Sitting-balance support: Feet supported, No upper extremity supported Sitting balance-Leahy Scale: Good     Standing balance support: Reliant on assistive device for balance, During functional activity, Bilateral upper extremity supported Standing balance-Leahy Scale: Poor                              Cognition Arousal/Alertness: Awake/alert Behavior During Therapy: WFL for tasks assessed/performed Overall Cognitive Status: Within Functional Limits for tasks assessed                                          Exercises Total Joint Exercises Ankle Circles/Pumps: AROM, Both, 15 reps, Supine Quad Sets: AROM, Right, 5 reps, Supine Short Arc Quad: AROM, Right, 10 reps, Supine Heel Slides: AAROM, Right, 10 reps, Supine Hip ABduction/ADduction: AAROM, Right, 10 reps, Supine    General Comments  Pertinent Vitals/Pain Pain Assessment Pain Score: 5  Pain Location: right hip Pain Descriptors / Indicators: Operative site guarding, Discomfort, Grimacing Pain Intervention(s): Limited activity within patient's tolerance, Monitored during session, Patient requesting pain meds-RN notified, Premedicated before session, Ice applied    Home Living                          Prior Function            PT Goals (current goals can now be found in the care plan  section) Acute Rehab PT Goals Patient Stated Goal: move well enough to go home (spouse cannot provide physical assist) PT Goal Formulation: With patient Time For Goal Achievement: 09/14/22 Potential to Achieve Goals: Good Progress towards PT goals: Progressing toward goals    Frequency    7X/week      PT Plan Current plan remains appropriate    Co-evaluation              AM-PAC PT "6 Clicks" Mobility   Outcome Measure  Help needed turning from your back to your side while in a flat bed without using bedrails?: A Little Help needed moving from lying on your back to sitting on the side of a flat bed without using bedrails?: A Lot Help needed moving to and from a bed to a chair (including a wheelchair)?: A Little Help needed standing up from a chair using your arms (e.g., wheelchair or bedside chair)?: A Lot Help needed to walk in hospital room?: A Little Help needed climbing 3-5 steps with a railing? : A Lot 6 Click Score: 15    End of Session Equipment Utilized During Treatment: Gait belt Activity Tolerance: Patient limited by pain Patient left: with call bell/phone within reach;in bed Nurse Communication: Mobility status PT Visit Diagnosis: Pain;Difficulty in walking, not elsewhere classified (R26.2) Pain - Right/Left: Right Pain - part of body: Hip     Time: 0175-1025 PT Time Calculation (min) (ACUTE ONLY): 32 min  Charges:  $Gait Training: 8-22 mins  $Therapeutic Activity: 8-22 mins                     Blondell Reveal Kistler PT 09/09/2022  Acute Rehabilitation Services  Office [phone]

## 2022-09-09 NOTE — Progress Notes (Signed)
   Subjective: 2 Days Post-Op Procedure(s) (LRB): TOTAL HIP ARTHROPLASTY ANTERIOR APPROACH (Right) Patient reports pain as mild.   Patient seen in rounds for Dr. Wynelle Link. Patient is well, and has had no acute complaints or problems other than soreness in the right hip. No issues overnight. Did well with therapy yesterday. Plan is to go Home after hospital stay.  Objective: Vital signs in last 24 hours: Temp:  [97.7 F (36.5 C)-98.8 F (37.1 C)] 97.7 F (36.5 C) (11/17 0622) Pulse Rate:  [80-88] 84 (11/17 0622) Resp:  [18] 18 (11/17 0622) BP: (137-144)/(66-72) 137/72 (11/17 0622) SpO2:  [95 %-97 %] 97 % (11/17 0622)  Intake/Output from previous day:  Intake/Output Summary (Last 24 hours) at 09/09/2022 0751 Last data filed at 09/09/2022 0622 Gross per 24 hour  Intake 1020 ml  Output 1200 ml  Net -180 ml    Intake/Output this shift: No intake/output data recorded.  Labs: Recent Labs    09/08/22 0315 09/09/22 0317  HGB 11.2* 10.2*   Recent Labs    09/08/22 0315 09/09/22 0317  WBC 11.7* 11.8*  RBC 3.52* 3.25*  HCT 34.1* 31.4*  PLT 141* 132*   Recent Labs    09/08/22 0315  NA 138  K 4.3  CL 104  CO2 27  BUN 12  CREATININE 0.74  GLUCOSE 154*  CALCIUM 8.7*   No results for input(s): "LABPT", "INR" in the last 72 hours.  Exam: General - Patient is Alert and Oriented Extremity - Neurologically intact Neurovascular intact Sensation intact distally Dorsiflexion/Plantar flexion intact Dressing/Incision - clean, dry, no drainage. Some mild bruising posterior to the aquacel dressing.  Motor Function - intact, moving foot and toes well on exam.   Past Medical History:  Diagnosis Date   Arthritis    hips and knees   Broken ankle    numbness in foot   GERD (gastroesophageal reflux disease)    Peripheral vascular disease (HCC)     Assessment/Plan: 2 Days Post-Op Procedure(s) (LRB): TOTAL HIP ARTHROPLASTY ANTERIOR APPROACH (Right) Principal Problem:    OA (osteoarthritis) of hip Active Problems:   Osteoarthritis of right hip   Primary osteoarthritis of right hip  Estimated body mass index is 34 kg/m as calculated from the following:   Height as of this encounter: 5' 4.5" (1.638 m).   Weight as of this encounter: 91.3 kg. Up with therapy  DVT Prophylaxis - Aspirin Weight-bearing as tolerated  Plan for discharge later today if progresses with therapy and meeting goals. HEP Follow-up in the office in 2 weeks  The PDMP database was reviewed today prior to any opioid medications being prescribed to this patient.  Theresa Duty, PA-C Orthopedic Surgery [phone] 09/09/2022, 7:51 AM

## 2022-09-09 NOTE — Progress Notes (Signed)
Physical Therapy Treatment Patient Details Name: Theresa Pierce MRN: [id number] DOB: 12-Jul-1946 Today's Date: 09/09/2022   History of Present Illness Pt is a 76yo female presenting s/p R-THAAA on 09/07/22. PMH: GERD, PVD, hx of L ankle fx with reduced sensation.    PT Comments    Increased time and effort for all mobility. Sit to stand from built up recliner required multiple attempts before she could successfully come to full stand. Pt ambulated 33' with RW, distance limited by pain and fatigue. Reviewed HEP. Pt is not yet mobilizing well enough to DC home where she will only have minimal help from her 24 y.o. spouse.     Recommendations for follow up therapy are one component of a multi-disciplinary discharge planning process, led by the attending physician.  Recommendations may be updated based on patient status, additional functional criteria and insurance authorization.  Follow Up Recommendations  Follow physician's recommendations for discharge plan and follow up therapies     Assistance Recommended at Discharge Frequent or constant Supervision/Assistance  Patient can return home with the following Help with stairs or ramp for entrance;Assist for transportation;Assistance with cooking/housework;A little help with bathing/dressing/bathroom;A little help with walking and/or transfers   Equipment Recommendations  None recommended by PT    Recommendations for Other Services       Precautions / Restrictions Precautions Precautions: Fall Restrictions Weight Bearing Restrictions: No RLE Weight Bearing: Weight bearing as tolerated     Mobility  Bed Mobility               General bed mobility comments: up in recliner    Transfers Overall transfer level: Needs assistance Equipment used: Rolling walker (2 wheels) Transfers: Sit to/from Stand Sit to Stand: Min assist, From elevated surface           General transfer comment: Sit to stand: min assist to power up.  VCs hand placement. Pt used momentum.  Increased time, multiple attempts required before pt able to come to full stand from built up recliner.    Ambulation/Gait Ambulation/Gait assistance: Min guard Gait Distance (Feet): 36 Feet Assistive device: Rolling walker (2 wheels) Gait Pattern/deviations: Step-to pattern Gait velocity: decr     General Gait Details: increased time, distance limited by pain and fatigue, VCs to lift head   Stairs             Wheelchair Mobility    Modified Rankin (Stroke Patients Only)       Balance Overall balance assessment: Needs assistance Sitting-balance support: Feet supported, No upper extremity supported Sitting balance-Leahy Scale: Good     Standing balance support: Reliant on assistive device for balance, During functional activity, Bilateral upper extremity supported Standing balance-Leahy Scale: Poor                              Cognition Arousal/Alertness: Awake/alert Behavior During Therapy: WFL for tasks assessed/performed Overall Cognitive Status: Within Functional Limits for tasks assessed                                          Exercises Total Joint Exercises Ankle Circles/Pumps: AROM, Both, 15 reps, Supine Quad Sets: AROM, Right, 5 reps, Supine Short Arc Quad: AROM, Right, 10 reps, Supine Heel Slides: AAROM, Right, 10 reps, Supine Hip ABduction/ADduction: AAROM, Right, 10 reps, Supine    General Comments  Pertinent Vitals/Pain Pain Assessment Pain Score: 6  Pain Location: right hip Pain Descriptors / Indicators: Operative site guarding, Discomfort, Grimacing Pain Intervention(s): Limited activity within patient's tolerance, Monitored during session, Premedicated before session, Ice applied    Home Living                          Prior Function            PT Goals (current goals can now be found in the care plan section) Acute Rehab PT Goals Patient Stated  Goal: move well enough to go home (spouse cannot provide physical assist) PT Goal Formulation: With patient Time For Goal Achievement: 09/14/22 Potential to Achieve Goals: Good Progress towards PT goals: Progressing toward goals    Frequency    7X/week      PT Plan Current plan remains appropriate    Co-evaluation              AM-PAC PT "6 Clicks" Mobility   Outcome Measure  Help needed turning from your back to your side while in a flat bed without using bedrails?: A Little Help needed moving from lying on your back to sitting on the side of a flat bed without using bedrails?: A Lot Help needed moving to and from a bed to a chair (including a wheelchair)?: A Little Help needed standing up from a chair using your arms (e.g., wheelchair or bedside chair)?: A Lot Help needed to walk in hospital room?: A Little Help needed climbing 3-5 steps with a railing? : A Lot 6 Click Score: 15    End of Session Equipment Utilized During Treatment: Gait belt Activity Tolerance: Patient limited by pain Patient left: in chair;with call bell/phone within reach;with chair alarm set Nurse Communication: Mobility status PT Visit Diagnosis: Pain;Difficulty in walking, not elsewhere classified (R26.2) Pain - Right/Left: Right Pain - part of body: Hip     Time: 0912-0957 PT Time Calculation (min) (ACUTE ONLY): 45 min  Charges:  $Gait Training: 8-22 mins $Therapeutic Exercise: 8-22 mins $Therapeutic Activity: 8-22 mins                     Blondell Reveal Kistler PT 09/09/2022  Acute Rehabilitation Services  Office [phone]

## 2022-09-10 NOTE — Progress Notes (Signed)
Physical Therapy Treatment Patient Details Name: Theresa Pierce MRN: [id number] DOB: 1946/09/05 Today's Date: 09/10/2022   History of Present Illness Pt is a 76yo female presenting s/p R-THAAA on 09/07/22. PMH: GERD, PVD, hx of L ankle fx with reduced sensation.    PT Comments    Pt is progressing slowly. She ambulated 48' + 25' with seated rest break 2* fatigue (especially in RUE) and R hip pain. Performed THA exercises with supervision. Pt was able to perform sit to stand from an elevated surface with decreased assistance today, but sit to stand remains labored and requires multiple attempts before she successfully rises to standing. She is not ready to DC home from a PT standpoint.     Recommendations for follow up therapy are one component of a multi-disciplinary discharge planning process, led by the attending physician.  Recommendations may be updated based on patient status, additional functional criteria and insurance authorization.  Follow Up Recommendations  Follow physician's recommendations for discharge plan and follow up therapies     Assistance Recommended at Discharge Frequent or constant Supervision/Assistance  Patient can return home with the following Help with stairs or ramp for entrance;Assist for transportation;Assistance with cooking/housework;A little help with bathing/dressing/bathroom;A little help with walking and/or transfers   Equipment Recommendations  None recommended by PT    Recommendations for Other Services       Precautions / Restrictions Precautions Precautions: Fall Restrictions Weight Bearing Restrictions: No RLE Weight Bearing: Weight bearing as tolerated     Mobility  Bed Mobility   Bed Mobility: Supine to Sit     Supine to sit: Min assist, HOB elevated     General bed mobility comments: min A to raise trunk, increased time/effort, used rail    Transfers Overall transfer level: Needs assistance Equipment used: Rolling walker  (2 wheels) Transfers: Sit to/from Stand Sit to Stand: From elevated surface, Min guard           General transfer comment: Sit to stand:. Pt used momentum. VCs for "nose over toes".  Increased time, multiple attempts required before pt able to come to full stand from elevated bed and from built up recliner.    Ambulation/Gait Ambulation/Gait assistance: Min guard Gait Distance (Feet): 45 Feet (45' + 25' with seated rest 2* fatigue) Assistive device: Rolling walker (2 wheels) Gait Pattern/deviations: Step-to pattern Gait velocity: decr     General Gait Details: 48' + 25' with seated rest 2* fatigue, increased time, very slow cadence,  distance limited by pain and fatigue, VCs to lift head   Stairs             Wheelchair Mobility    Modified Rankin (Stroke Patients Only)       Balance Overall balance assessment: Needs assistance Sitting-balance support: Feet supported, No upper extremity supported Sitting balance-Leahy Scale: Good     Standing balance support: Reliant on assistive device for balance, During functional activity, Bilateral upper extremity supported Standing balance-Leahy Scale: Poor                              Cognition Arousal/Alertness: Awake/alert Behavior During Therapy: WFL for tasks assessed/performed Overall Cognitive Status: Within Functional Limits for tasks assessed                                          Exercises  Total Joint Exercises Ankle Circles/Pumps: AROM, Both, 15 reps, Supine Heel Slides: AAROM, Right, 10 reps, Supine Hip ABduction/ADduction: AAROM, Right, 10 reps, Supine    General Comments        Pertinent Vitals/Pain Pain Assessment Pain Score: 5  Pain Location: right hip Pain Descriptors / Indicators: Operative site guarding, Discomfort, Grimacing Pain Intervention(s): Limited activity within patient's tolerance, Monitored during session, Premedicated before session, Ice applied     Home Living                          Prior Function            PT Goals (current goals can now be found in the care plan section) Acute Rehab PT Goals Patient Stated Goal: move well enough to go home (spouse cannot provide physical assist) PT Goal Formulation: With patient Time For Goal Achievement: 09/14/22 Potential to Achieve Goals: Good Progress towards PT goals: Progressing toward goals    Frequency    7X/week      PT Plan Current plan remains appropriate    Co-evaluation              AM-PAC PT "6 Clicks" Mobility   Outcome Measure  Help needed turning from your back to your side while in a flat bed without using bedrails?: A Little Help needed moving from lying on your back to sitting on the side of a flat bed without using bedrails?: A Lot Help needed moving to and from a bed to a chair (including a wheelchair)?: A Little Help needed standing up from a chair using your arms (e.g., wheelchair or bedside chair)?: A Little Help needed to walk in hospital room?: A Little Help needed climbing 3-5 steps with a railing? : A Lot 6 Click Score: 16    End of Session Equipment Utilized During Treatment: Gait belt Activity Tolerance: Patient limited by pain;Patient limited by fatigue Patient left: with call bell/phone within reach;in chair;with chair alarm set Nurse Communication: Mobility status PT Visit Diagnosis: Pain;Difficulty in walking, not elsewhere classified (R26.2) Pain - Right/Left: Right Pain - part of body: Hip     Time: 0902-0949 PT Time Calculation (min) (ACUTE ONLY): 47 min  Charges:  $Gait Training: 8-22 mins $Therapeutic Exercise: 8-22 mins $Therapeutic Activity: 8-22 mins                     Blondell Reveal Kistler PT 09/10/2022  Acute Rehabilitation Services  Office [phone]

## 2022-09-10 NOTE — Progress Notes (Signed)
Physical Therapy Treatment Patient Details Name: Theresa Pierce MRN: [id number] DOB: 13-Apr-1946 Today's Date: 09/10/2022   History of Present Illness Pt is a 76yo female presenting s/p R-THAAA on 09/07/22. PMH: GERD, PVD, hx of L ankle fx with reduced sensation.    PT Comments    Pt tolerated significant increase in ambulation distance of 110' today, no loss of balance, verbal cues to maintain hands on RW as she tends to "talk with her hands". Ambulation distance limited by R shoulder and knee pain, pt reports these joints are, "wore out". Sit to stand remains labored, and pt requires use of momentum and multiple attempts before she can successfully rise to a full stand.     Recommendations for follow up therapy are one component of a multi-disciplinary discharge planning process, led by the attending physician.  Recommendations may be updated based on patient status, additional functional criteria and insurance authorization.  Follow Up Recommendations  Follow physician's recommendations for discharge plan and follow up therapies     Assistance Recommended at Discharge Frequent or constant Supervision/Assistance  Patient can return home with the following Help with stairs or ramp for entrance;Assist for transportation;Assistance with cooking/housework;A little help with bathing/dressing/bathroom;A little help with walking and/or transfers   Equipment Recommendations  None recommended by PT    Recommendations for Other Services       Precautions / Restrictions Precautions Precautions: Fall Restrictions Weight Bearing Restrictions: No RLE Weight Bearing: Weight bearing as tolerated     Mobility  Bed Mobility   Bed Mobility: Supine to Sit     Supine to sit: Min assist, HOB elevated     General bed mobility comments: up in recliner    Transfers Overall transfer level: Needs assistance Equipment used: Rolling walker (2 wheels) Transfers: Sit to/from Stand Sit to  Stand: From elevated surface, Min guard           General transfer comment: Sit to stand:. Pt used momentum. VCs for "nose over toes".  Increased time, multiple attempts required before pt able to come to full stand from built up recliner.    Ambulation/Gait Ambulation/Gait assistance: Min guard Gait Distance (Feet): 110 Feet Assistive device: Rolling walker (2 wheels) Gait Pattern/deviations: Step-to pattern Gait velocity: decr     General Gait Details: increased time, very slow cadence,  distance limited by pain and fatigue in R shoulder and knee (pt reports they are, "wore out"), VCs to lift head and to keep hands on RW as she tends to talk with her hands   Stairs             Wheelchair Mobility    Modified Rankin (Stroke Patients Only)       Balance Overall balance assessment: Needs assistance Sitting-balance support: Feet supported, No upper extremity supported Sitting balance-Leahy Scale: Good     Standing balance support: Reliant on assistive device for balance, During functional activity, Bilateral upper extremity supported Standing balance-Leahy Scale: Poor                              Cognition Arousal/Alertness: Awake/alert Behavior During Therapy: WFL for tasks assessed/performed Overall Cognitive Status: Within Functional Limits for tasks assessed                                          Exercises Total Joint Exercises  Ankle Circles/Pumps: AROM, Both, 15 reps, Supine Heel Slides: AAROM, Right, 10 reps, Supine Hip ABduction/ADduction: AAROM, Right, 10 reps, Supine    General Comments        Pertinent Vitals/Pain Pain Assessment Pain Score: 4  Pain Location: right hip Pain Descriptors / Indicators: Operative site guarding, Discomfort, Grimacing Pain Intervention(s): Limited activity within patient's tolerance, Monitored during session, Premedicated before session, Ice applied    Home Living                           Prior Function            PT Goals (current goals can now be found in the care plan section) Acute Rehab PT Goals Patient Stated Goal: move well enough to go home (spouse cannot provide physical assist) PT Goal Formulation: With patient Time For Goal Achievement: 09/14/22 Potential to Achieve Goals: Good Progress towards PT goals: Progressing toward goals    Frequency    7X/week      PT Plan Current plan remains appropriate    Co-evaluation              AM-PAC PT "6 Clicks" Mobility   Outcome Measure  Help needed turning from your back to your side while in a flat bed without using bedrails?: A Little Help needed moving from lying on your back to sitting on the side of a flat bed without using bedrails?: A Lot Help needed moving to and from a bed to a chair (including a wheelchair)?: A Little Help needed standing up from a chair using your arms (e.g., wheelchair or bedside chair)?: A Little Help needed to walk in hospital room?: A Little Help needed climbing 3-5 steps with a railing? : A Lot 6 Click Score: 16    End of Session Equipment Utilized During Treatment: Gait belt Activity Tolerance: Patient limited by pain;Patient limited by fatigue Patient left: with call bell/phone within reach;in chair;with chair alarm set Nurse Communication: Mobility status PT Visit Diagnosis: Pain;Difficulty in walking, not elsewhere classified (R26.2) Pain - Right/Left: Right Pain - part of body: Hip     Time: 2993-7169 PT Time Calculation (min) (ACUTE ONLY): 39 min  Charges:  $Gait Training: 8-22 mins $Therapeutic Exercise: 8-22 mins $Therapeutic Activity: 8-22 mins                     Blondell Reveal Kistler PT 09/10/2022  Acute Rehabilitation Services  Office [phone]

## 2022-09-10 NOTE — Progress Notes (Signed)
   Subjective: 3 Days Post-Op Procedure(s) (LRB): TOTAL HIP ARTHROPLASTY ANTERIOR APPROACH (Right) Patient reports pain as mild.   Patient seen in rounds for Dr. Wynelle Link. Patient is resting in bed on exam this morning. She is concerned about her mobility for discharge home. She asked about [facility]. I discussed with her PT this morning as well who tells me her progress with mobility has been slow. She did ambulate 70 feet yesterday.  We will continue therapy today.   Objective: Vital signs in last 24 hours: Temp:  [97.6 F (36.4 C)-99.3 F (37.4 C)] 97.6 F (36.4 C) (11/18 0616) Pulse Rate:  [69-86] 86 (11/18 0616) Resp:  [17-18] 17 (11/18 0616) BP: (130-155)/(65-72) 155/72 (11/18 0616) SpO2:  [99 %-100 %] 99 % (11/18 0616)  Intake/Output from previous day:  Intake/Output Summary (Last 24 hours) at 09/10/2022 0937 Last data filed at 09/10/2022 0828 Gross per 24 hour  Intake 1200 ml  Output 400 ml  Net 800 ml     Intake/Output this shift: Total I/O In: -  Out: 100 [Urine:100]  Labs: Recent Labs    09/08/22 0315 09/09/22 0317  HGB 11.2* 10.2*   Recent Labs    09/08/22 0315 09/09/22 0317  WBC 11.7* 11.8*  RBC 3.52* 3.25*  HCT 34.1* 31.4*  PLT 141* 132*   Recent Labs    09/08/22 0315  NA 138  K 4.3  CL 104  CO2 27  BUN 12  CREATININE 0.74  GLUCOSE 154*  CALCIUM 8.7*   No results for input(s): "LABPT", "INR" in the last 72 hours.  Exam: General - Patient is Alert and Oriented Extremity - Neurologically intact Sensation intact distally Intact pulses distally Dorsiflexion/Plantar flexion intact Dressing - dressing C/D/I Motor Function - intact, moving foot and toes well on exam.   Past Medical History:  Diagnosis Date   Arthritis    hips and knees   Broken ankle    numbness in foot   GERD (gastroesophageal reflux disease)    Peripheral vascular disease (HCC)     Assessment/Plan: 3 Days Post-Op Procedure(s) (LRB): TOTAL HIP  ARTHROPLASTY ANTERIOR APPROACH (Right) Principal Problem:   OA (osteoarthritis) of hip Active Problems:   Osteoarthritis of right hip   Primary osteoarthritis of right hip  Estimated body mass index is 34 kg/m as calculated from the following:   Height as of this encounter: 5' 4.5" (1.638 m).   Weight as of this encounter: 91.3 kg. Advance diet Up with therapy  DVT Prophylaxis - Aspirin Weight bearing as tolerated.  Plan is to go Home after hospital stay. Would continue to work with PT towards goal of discharge home, as we have concerns about post-op care at SNF, including adequate PT, wound infections, pressure sores, etc.   If she is unable to progress over the weekend, Dr. Wynelle Link may choose an alternate disposition.    Griffith Citron, PA-C Orthopedic Surgery [phone] 09/10/2022, 9:37 AM

## 2022-09-11 NOTE — Progress Notes (Signed)
Physical Therapy Treatment Patient Details Name: Theresa Pierce MRN: [id number] DOB: 1945-12-31 Today's Date: 09/11/2022   History of Present Illness Pt is a 76yo female presenting s/p R-THAAA on 09/07/22. PMH: GERD, PVD, hx of L ankle fx with reduced sensation.    PT Comments    Pt continues very cooperative but struggling to progress with mobility 2* pain and premorbid deconditioning.  This am, pt performed therex program with assist and up to EOB sitting before requesting to use BSC.  Up from Theresa Pierce with assist of CNA for hygiene and ambulated to door but struggling with pain and assisted to sit in recliner - ice packs provided and RN to bring pain meds when allowed.   Recommendations for follow up therapy are one component of a multi-disciplinary discharge planning process, led by the attending physician.  Recommendations may be updated based on patient status, additional functional criteria and insurance authorization.  Follow Up Recommendations  Follow physician's recommendations for discharge plan and follow up therapies     Assistance Recommended at Discharge Frequent or constant Supervision/Assistance  Patient can return home with the following Help with stairs or ramp for entrance;Assist for transportation;Assistance with cooking/housework;A little help with bathing/dressing/bathroom;A little help with walking and/or transfers   Equipment Recommendations  None recommended by PT    Recommendations for Other Services       Precautions / Restrictions Precautions Precautions: Fall Restrictions Weight Bearing Restrictions: No RLE Weight Bearing: Weight bearing as tolerated     Mobility  Bed Mobility Overal bed mobility: Needs Assistance Bed Mobility: Supine to Sit     Supine to sit: Min assist, HOB elevated     General bed mobility comments: Increased time, extensive use of bedrails and physical assist to manage R LE and bring trunk to upright    Transfers Overall  transfer level: Needs assistance Equipment used: Rolling walker (2 wheels) Transfers: Sit to/from Stand Sit to Stand: From elevated surface, Min assist   Step pivot transfers: Min assist       General transfer comment: Sit to stand:. Pt used momentum. VCs for "nose over toes".  Increased time, multiple attempts required before pt able to come to full stand from EOB and from [facility]    Ambulation/Gait Ambulation/Gait assistance: Min guard Gait Distance (Feet): 10 Feet Assistive device: Rolling walker (2 wheels) Gait Pattern/deviations: Step-to pattern Gait velocity: decr     General Gait Details: increased time, very slow cadence,  distance limited by pain and fatigue in R shoulder and knee (pt reports they are, "wore out")   Stairs             Wheelchair Mobility    Modified Rankin (Stroke Patients Only)       Balance Overall balance assessment: Needs assistance Sitting-balance support: Feet supported, No upper extremity supported Sitting balance-Leahy Scale: Good     Standing balance support: Reliant on assistive device for balance, During functional activity, Bilateral upper extremity supported Standing balance-Leahy Scale: Poor Standing balance comment: Pt with strong anterior trunk flexion, did not stand upright during session - pt states this was her norm prior to surgery                            Cognition Arousal/Alertness: Awake/alert Behavior During Therapy: WFL for tasks assessed/performed Overall Cognitive Status: Within Functional Limits for tasks assessed  Exercises Total Joint Exercises Ankle Circles/Pumps: AROM, Both, 15 reps, Supine Quad Sets: AROM, Supine, 10 reps, Both Short Arc Quad: AROM, Right, 10 reps, Supine Heel Slides: AAROM, Right, Supine, 20 reps Hip ABduction/ADduction: AAROM, Right, Supine, 15 reps    General Comments        Pertinent Vitals/Pain Pain  Assessment Pain Assessment: 0-10 Pain Score: 6  Pain Location: right hip Pain Descriptors / Indicators: Operative site guarding, Discomfort, Grimacing Pain Intervention(s): Limited activity within patient's tolerance, Monitored during session, Premedicated before session, Patient requesting pain meds-RN notified, Ice applied    Home Living                          Prior Function            PT Goals (current goals can now be found in the care plan section) Acute Rehab PT Goals Patient Stated Goal: move well enough to go home (spouse cannot provide physical assist) PT Goal Formulation: With patient Time For Goal Achievement: 09/14/22 Potential to Achieve Goals: Good Progress towards PT goals: Progressing toward goals    Frequency    7X/week      PT Plan Current plan remains appropriate    Co-evaluation              AM-PAC PT "6 Clicks" Mobility   Outcome Measure  Help needed turning from your back to your side while in a flat bed without using bedrails?: A Little Help needed moving from lying on your back to sitting on the side of a flat bed without using bedrails?: A Lot Help needed moving to and from a bed to a chair (including a wheelchair)?: A Little Help needed standing up from a chair using your arms (e.g., wheelchair or bedside chair)?: A Little Help needed to walk in Pierce room?: A Little Help needed climbing 3-5 steps with a railing? : A Lot 6 Click Score: 16    End of Session Equipment Utilized During Treatment: Gait belt Activity Tolerance: Patient limited by pain;Patient limited by fatigue Patient left: with call bell/phone within reach;in chair;with chair alarm set Nurse Communication: Mobility status PT Visit Diagnosis: Pain;Difficulty in walking, not elsewhere classified (R26.2) Pain - Right/Left: Right Pain - part of body: Hip     Time: 5625-6389 PT Time Calculation (min) (ACUTE ONLY): 35 min  Charges:  $Therapeutic Exercise:  8-22 mins $Therapeutic Activity: 8-22 mins                     Theresa Pierce PT Acute Rehabilitation Services Pager [phone] Office [phone]    Theresa Pierce 09/11/2022, 10:17 AM

## 2022-09-11 NOTE — Progress Notes (Signed)
Patient ID: LIZMARY NADER, female   DOB: 1946-08-30, 76 y.o.   MRN: [id number] Subjective: 4 Days Post-Op Procedure(s) (LRB): TOTAL HIP ARTHROPLASTY ANTERIOR APPROACH (Right)    Patient reports pain as moderate. Challenge continues with her mobility as sh reports not only challenges with post op right LE but also her contralateral LE Otherwise no other events reported or noted  Objective:   VITALS:   Vitals:   09/10/22 2113 09/11/22 0513  BP: (!) 145/85 (!) 165/86  Pulse: 85 90  Resp: 18 18  Temp: 99.2 F (37.3 C) 99.6 F (37.6 C)  SpO2: 100% 96%    Neurovascular intact Incision: dressing C/D/I  LABS Recent Labs    09/09/22 0317  HGB 10.2*  HCT 31.4*  WBC 11.8*  PLT 132*    No results for input(s): "NA", "K", "BUN", "CREATININE", "GLUCOSE" in the last 72 hours.  No results for input(s): "LABPT", "INR" in the last 72 hours.   Assessment/Plan: 4 Days Post-Op Procedure(s) (LRB): TOTAL HIP ARTHROPLASTY ANTERIOR APPROACH (Right)   Up with therapy Continue to work with PT for plans of home safe discharge Hopefully she will progress for discharge tomorrow vs Tuesday

## 2022-09-11 NOTE — Progress Notes (Signed)
Physical Therapy Treatment Patient Details Name: Theresa Pierce MRN: [id number] DOB: 11/21/1945 Today's Date: 09/11/2022   History of Present Illness Pt is a 76yo female presenting s/p R-THAAA on 09/07/22. PMH: GERD, PVD, hx of L ankle fx with reduced sensation.    PT Comments    Pt very cooperative this pm but reports increased pain and fatigue despite premed and nap prior to session.  Pt assisted (with multiple attempts) to stand from built up recliner and transferred to Theresa Pierce.  Pt up from Theresa Pierce to ambulate in hall but returned to room in chair 2* fatigue and"my knee is locking up".   Recommendations for follow up therapy are one component of a multi-disciplinary discharge planning process, led by the attending physician.  Recommendations may be updated based on patient status, additional functional criteria and insurance authorization.  Follow Up Recommendations  Follow physician's recommendations for discharge plan and follow up therapies     Assistance Recommended at Discharge Frequent or constant Supervision/Assistance  Patient can return home with the following Help with stairs or ramp for entrance;Assist for transportation;Assistance with cooking/housework;A little help with bathing/dressing/bathroom;A little help with walking and/or transfers   Equipment Recommendations  None recommended by PT    Recommendations for Other Services       Precautions / Restrictions Precautions Precautions: Fall Restrictions Weight Bearing Restrictions: No RLE Weight Bearing: Weight bearing as tolerated     Mobility  Bed Mobility Overal bed mobility: Needs Assistance Bed Mobility: Supine to Sit     Supine to sit: Min assist, HOB elevated     General bed mobility comments: Pt up in chair and returned to same    Transfers Overall transfer level: Needs assistance Equipment used: Rolling walker (2 wheels) Transfers: Sit to/from Stand Sit to Stand: From elevated surface, Min assist    Step pivot transfers: Min assist       General transfer comment: Sit to stand:. Pt used momentum. VCs for "nose over toes".  Increased time, multiple attempts required before pt able to come to full stand from EOB and from Theresa Pierce    Ambulation/Gait Ambulation/Gait assistance: Min guard Gait Distance (Feet): 75 Feet Assistive device: Rolling walker (2 wheels) Gait Pattern/deviations: Step-to pattern Gait velocity: decr     General Gait Details: increased time, very slow cadence,  distance limited by pain and fatigue in R shoulder and knee (pt reports they are, "wore out")   Stairs             Wheelchair Mobility    Modified Rankin (Stroke Patients Only)       Balance Overall balance assessment: Needs assistance Sitting-balance support: Feet supported, No upper extremity supported Sitting balance-Leahy Scale: Good     Standing balance support: Reliant on assistive device for balance, During functional activity, Bilateral upper extremity supported Standing balance-Leahy Scale: Poor Standing balance comment: Pt with strong anterior trunk flexion, did not stand upright during session - pt states this was her norm prior to surgery                            Cognition Arousal/Alertness: Awake/alert Behavior During Therapy: WFL for tasks assessed/performed Overall Cognitive Status: Within Functional Limits for tasks assessed                                          Exercises  Total Joint Exercises Ankle Circles/Pumps: AROM, Both, 15 reps, Supine Quad Sets: AROM, Supine, 10 reps, Both Short Arc Quad: AROM, Right, 10 reps, Supine Heel Slides: AAROM, Right, Supine, 20 reps Hip ABduction/ADduction: AAROM, Right, Supine, 15 reps    General Comments        Pertinent Vitals/Pain Pain Assessment Pain Assessment: 0-10 Pain Score: 6  Pain Location: right hip Pain Descriptors / Indicators: Operative site guarding, Discomfort, Grimacing Pain  Intervention(s): Limited activity within patient's tolerance, Monitored during session, Premedicated before session, Ice applied    Home Living                          Prior Function            PT Goals (current goals can now be found in the care plan section) Acute Rehab PT Goals Patient Stated Goal: move well enough to go home (spouse cannot provide physical assist) PT Goal Formulation: With patient Time For Goal Achievement: 09/14/22 Potential to Achieve Goals: Good Progress towards PT goals: Progressing toward goals    Frequency    7X/week      PT Plan Current plan remains appropriate    Co-evaluation              AM-PAC PT "6 Clicks" Mobility   Outcome Measure  Help needed turning from your back to your side while in a flat bed without using bedrails?: A Little Help needed moving from lying on your back to sitting on the side of a flat bed without using bedrails?: A Lot Help needed moving to and from a bed to a chair (including a wheelchair)?: A Little Help needed standing up from a chair using your arms (e.g., wheelchair or bedside chair)?: A Little Help needed to walk in hospital room?: A Little Help needed climbing 3-5 steps with a railing? : A Lot 6 Click Score: 16    End of Session Equipment Utilized During Treatment: Gait belt Activity Tolerance: Patient limited by pain;Patient limited by fatigue Patient left: with call bell/phone within reach;in chair;with chair alarm set Nurse Communication: Mobility status PT Visit Diagnosis: Pain;Difficulty in walking, not elsewhere classified (R26.2) Pain - Right/Left: Right Pain - part of body: Hip     Time: 1155-1240 PT Time Calculation (min) (ACUTE ONLY): 45 min  Charges:  $Gait Training: 23-37 mins $Therapeutic Exercise: 8-22 mins $Therapeutic Activity: 8-22 mins                     Theresa Pierce PT Acute Rehabilitation Services Pager [phone] Office  [phone]    Theresa Pierce 09/11/2022, 1:04 PM

## 2022-09-12 NOTE — Plan of Care (Signed)

## 2022-09-12 NOTE — Progress Notes (Signed)
   Subjective: 5 Days Post-Op Procedure(s) (LRB): TOTAL HIP ARTHROPLASTY ANTERIOR APPROACH (Right) Patient seen in rounds by Dr. Wynelle Link. Patient is well, and has had no acute complaints or problems. Denies SOB or chest pain. Denies calf pain.  Patient reports pain as moderate.  Ambulated 36' during second session of physical therapy yesterday. Plan is to go Home after hospital stay.  Objective: Vital signs in last 24 hours: Temp:  [98.3 F (36.8 C)-98.9 F (37.2 C)] 98.3 F (36.8 C) (11/20 0444) Pulse Rate:  [81-94] 81 (11/20 0444) Resp:  [16-17] 17 (11/20 0444) BP: (121-136)/(53-60) 136/54 (11/20 0444) SpO2:  [95 %-100 %] 95 % (11/20 0444)  Intake/Output from previous day:  Intake/Output Summary (Last 24 hours) at 09/12/2022 2122 Last data filed at 09/12/2022 0600 Gross per 24 hour  Intake 1140 ml  Output 900 ml  Net 240 ml    Intake/Output this shift: No intake/output data recorded.  Labs: No results for input(s): "HGB" in the last 72 hours. No results for input(s): "WBC", "RBC", "HCT", "PLT" in the last 72 hours. No results for input(s): "NA", "K", "CL", "CO2", "BUN", "CREATININE", "GLUCOSE", "CALCIUM" in the last 72 hours. No results for input(s): "LABPT", "INR" in the last 72 hours.  Exam: General - Patient is Alert and Oriented Extremity - Neurologically intact Neurovascular intact Sensation intact distally Dorsiflexion/Plantar flexion intact Dressing/Incision - clean, dry, no drainage Motor Function - intact, moving foot and toes well on exam.  Past Medical History:  Diagnosis Date   Arthritis    hips and knees   Broken ankle    numbness in foot   GERD (gastroesophageal reflux disease)    Peripheral vascular disease (HCC)     Assessment/Plan: 5 Days Post-Op Procedure(s) (LRB): TOTAL HIP ARTHROPLASTY ANTERIOR APPROACH (Right) Principal Problem:   OA (osteoarthritis) of hip Active Problems:   Osteoarthritis of right hip   Primary osteoarthritis of  right hip  Estimated body mass index is 34 kg/m as calculated from the following:   Height as of this encounter: 5' 4.5" (1.638 m).   Weight as of this encounter: 91.3 kg.  DVT Prophylaxis - Aspirin Weight-bearing as tolerated.  Continue with physical therapy. Hopeful for discharge today. May possibly require one additional night in hospital.  [place], PA-C Orthopedic Surgery [phone] 09/12/2022, 7:12 AM

## 2022-09-12 NOTE — Progress Notes (Signed)
Physical Therapy Treatment Patient Details Name: Theresa Pierce MRN: [id number] DOB: 01-09-1946 Today's Date: 09/12/2022   History of Present Illness Pt is a 76yo female presenting s/p R-THAAA on 09/07/22. PMH: GERD, PVD, hx of L ankle fx with reduced sensation.    PT Comments    The  patient reports that she is concerned about going up/down a 3 " step . Reviewed verbally and visually forward and backward, offered to practice the 6 " step, patient declined.  Patient mobilizing  slowly. She has access to a lift chair and recliner.  Continue another visit prior to planned DC today. Son will pick her up/   Recommendations for follow up therapy are one component of a multi-disciplinary discharge planning process, led by the attending physician.  Recommendations may be updated based on patient status, additional functional criteria and insurance authorization.  Follow Up Recommendations  Follow physician's recommendations for discharge plan and follow up therapies     Assistance Recommended at Discharge Frequent or constant Supervision/Assistance  Patient can return home with the following Help with stairs or ramp for entrance;Assist for transportation;Assistance with cooking/housework;A little help with bathing/dressing/bathroom;A little help with walking and/or transfers   Equipment Recommendations  None recommended by PT    Recommendations for Other Services       Precautions / Restrictions Precautions Precautions: Fall Restrictions RLE Weight Bearing: Weight bearing as tolerated     Mobility  Bed Mobility Overal bed mobility: Needs Assistance Bed Mobility: Sit to Supine       Sit to supine: Min assist   General bed mobility comments: assist with  right leg onto bed    Transfers Overall transfer level: Needs assistance Equipment used: Rolling walker (2 wheels) Transfers: Sit to/from Stand Sit to Stand: From elevated surface, Min assist, Mod assist            General transfer comment: mod to power up from the recliner    Ambulation/Gait Ambulation/Gait assistance: Min guard Gait Distance (Feet): 50 Feet Assistive device: Rolling walker (2 wheels) Gait Pattern/deviations: Step-to pattern, Step-through pattern Gait velocity: decr     General Gait Details: increased time, very slow cadence,  distance limited by pain and fatigue in R shoulder and knee with a slight buckling  near end of walk   Stairs Stairs: Yes       General stair comments: Patient espresses concern for going up the 3" step. Visually reviewed going forward and backward and  provided a handout with instructions. will review again next visist.   Wheelchair Mobility    Modified Rankin (Stroke Patients Only)       Balance Overall balance assessment: Needs assistance Sitting-balance support: Feet supported, No upper extremity supported Sitting balance-Leahy Scale: Good     Standing balance support: Reliant on assistive device for balance, During functional activity, Bilateral upper extremity supported Standing balance-Leahy Scale: Poor                              Cognition Arousal/Alertness: Awake/alert Behavior During Therapy: WFL for tasks assessed/performed Overall Cognitive Status: Within Functional Limits for tasks assessed                                          Exercises      General Comments        Pertinent Vitals/Pain Pain  Assessment Pain Score: 5  Pain Location: right hip, and left shoulder(pulling on the rail) Pain Descriptors / Indicators: Operative site guarding, Discomfort, Grimacing Pain Intervention(s): Monitored during session, Limited activity within patient's tolerance, Premedicated before session, Ice applied    Home Living         Home Access: Stairs to enter                Prior Function            PT Goals (current goals can now be found in the care plan section) Progress towards  PT goals: Progressing toward goals    Frequency    7X/week      PT Plan Current plan remains appropriate    Co-evaluation              AM-PAC PT "6 Clicks" Mobility   Outcome Measure  Help needed turning from your back to your side while in a flat bed without using bedrails?: A Little Help needed moving from lying on your back to sitting on the side of a flat bed without using bedrails?: A Little Help needed moving to and from a bed to a chair (including a wheelchair)?: A Little Help needed standing up from a chair using your arms (e.g., wheelchair or bedside chair)?: A Lot Help needed to walk in hospital room?: A Little Help needed climbing 3-5 steps with a railing? : A Lot 6 Click Score: 16    End of Session Equipment Utilized During Treatment: Gait belt Activity Tolerance: Patient limited by pain;Patient limited by fatigue Patient left: in bed;with chair alarm set;with call bell/phone within reach;with nursing/sitter in room Nurse Communication: Mobility status PT Visit Diagnosis: Pain;Difficulty in walking, not elsewhere classified (R26.2) Pain - Right/Left: Right Pain - part of body: Hip     Time: 1100-1155 PT Time Calculation (min) (ACUTE ONLY): 55 min  Charges:  $Gait Training: 8-22 mins $Therapeutic Activity: 8-22 mins $Self Care/Home Management: [place] Office [phone] Weekend [phone]    Claretha Cooper 09/12/2022, 1:28 PM

## 2022-09-12 NOTE — Progress Notes (Signed)
Physical Therapy Treatment Patient Details Name: Theresa Pierce MRN: [id number] DOB: Jul 25, 1946 Today's Date: 09/12/2022   History of Present Illness Pt is a 76yo female presenting s/p R-THAAA on 09/07/22. PMH: GERD, PVD, hx of L ankle fx with reduced sensation.    PT Comments    Reviewed HEP and negotiate small step going backwards, using RW. Patient just got back into bed after going to [facility]. Patient  will DC later today when son is able to  pick her up.    Recommendations for follow up therapy are one component of a multi-disciplinary discharge planning process, led by the attending physician.  Recommendations may be updated based on patient status, additional functional criteria and insurance authorization.  Follow Up Recommendations  Follow physician's recommendations for discharge plan and follow up therapies     Assistance Recommended at Discharge Frequent or constant Supervision/Assistance  Patient can return home with the following Help with stairs or ramp for entrance;Assist for transportation;Assistance with cooking/housework;A little help with bathing/dressing/bathroom;A little help with walking and/or transfers   Equipment Recommendations  None recommended by PT    Recommendations for Other Services       Precautions / Restrictions Precautions Precautions: Fall Restrictions RLE Weight Bearing: Weight bearing as tolerated     Mobility  Bed Mobility Overal bed mobility: Needs Assistance Bed Mobility: Sit to Supine       Sit to supine: Min assist   General bed mobility comments: deferred    Transfers Overall transfer level: Needs assistance Equipment used: Rolling walker (2 wheels) Transfers: Sit to/from Stand Sit to Stand: From elevated surface, Min assist, Mod assist           General transfer comment: mod to power up from the recliner    Ambulation/Gait Ambulation/Gait assistance: Min guard Gait Distance (Feet): 50 Feet Assistive device:  Rolling walker (2 wheels) Gait Pattern/deviations: Step-to pattern, Step-through pattern Gait velocity: decr     General Gait Details: increased time, very slow cadence,  distance limited by pain and fatigue in R shoulder and knee with a slight buckling  near end of walk   Stairs Stairs: Yes       General stair comments: demonstrated on 8' step  how to negotiate going backwards.   Wheelchair Mobility    Modified Rankin (Stroke Patients Only)       Balance Overall balance assessment: Needs assistance Sitting-balance support: Feet supported, No upper extremity supported Sitting balance-Leahy Scale: Good     Standing balance support: Reliant on assistive device for balance, During functional activity, Bilateral upper extremity supported Standing balance-Leahy Scale: Poor                              Cognition Arousal/Alertness: Awake/alert Behavior During Therapy: WFL for tasks assessed/performed Overall Cognitive Status: Within Functional Limits for tasks assessed                                          Exercises Total Joint Exercises Ankle Circles/Pumps: AROM Short Arc Quad: AROM, Right, 10 reps, Supine Heel Slides: AAROM, Right, Supine, 20 reps, AROM Hip ABduction/ADduction: Right, Supine, 15 reps, AROM, AAROM    General Comments        Pertinent Vitals/Pain Pain Assessment Pain Score: 3  Pain Location: right hip, and left shoulder(pulling on the rail) Pain Descriptors / Indicators:  Operative site guarding, Discomfort, Grimacing Pain Intervention(s): Monitored during session, Premedicated before session    Home Living                          Prior Function            PT Goals (current goals can now be found in the care plan section) Progress towards PT goals: Progressing toward goals    Frequency    7X/week      PT Plan Current plan remains appropriate    Co-evaluation              AM-PAC  PT "6 Clicks" Mobility   Outcome Measure  Help needed turning from your back to your side while in a flat bed without using bedrails?: A Little Help needed moving from lying on your back to sitting on the side of a flat bed without using bedrails?: A Little Help needed moving to and from a bed to a chair (including a wheelchair)?: A Little Help needed standing up from a chair using your arms (e.g., wheelchair or bedside chair)?: A Little Help needed to walk in hospital room?: A Little Help needed climbing 3-5 steps with a railing? : A Lot 6 Click Score: 17    End of Session Equipment Utilized During Treatment: Gait belt Activity Tolerance: Patient tolerated treatment well Patient left: in bed;with chair alarm set;with call bell/phone within reach;with nursing/sitter in room Nurse Communication: Mobility status PT Visit Diagnosis: Pain;Difficulty in walking, not elsewhere classified (R26.2) Pain - Right/Left: Right Pain - part of body: Hip     Time: 1411-1445 PT Time Calculation (min) (ACUTE ONLY): 34 min  Charges:  $Gait Training: 8-22 mins $Therapeutic Exercise: 8-22 mins $Therapeutic Activity: 8-22 mins $Self Care/Home Management: 8-22                     [place] Office [phone] Weekend [phone]    Claretha Cooper 09/12/2022, 4:07 PM
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
## 2022-09-16 ENCOUNTER — Emergency Department (HOSPITAL_COMMUNITY): Payer: Medicare Other

## 2022-09-16 ENCOUNTER — Emergency Department (HOSPITAL_COMMUNITY)
Admission: EM | Admit: 2022-09-16 | Discharge: 2022-09-16 | Disposition: A | Discharge status: Home / Self Care | Payer: Medicare Other | Attending: Emergency Medicine | Admitting: Emergency Medicine

## 2022-09-16 ENCOUNTER — Other Ambulatory Visit: Payer: Self-pay

## 2022-09-16 ENCOUNTER — Encounter (HOSPITAL_COMMUNITY): Payer: Self-pay

## 2022-09-16 DIAGNOSIS — R0600 Dyspnea, unspecified: Secondary | ICD-10-CM | POA: Insufficient documentation

## 2022-09-16 DIAGNOSIS — Z96649 Presence of unspecified artificial hip joint: Secondary | ICD-10-CM | POA: Insufficient documentation

## 2022-09-16 DIAGNOSIS — R000 Tachycardia, unspecified: Secondary | ICD-10-CM | POA: Diagnosis not present

## 2022-09-16 DIAGNOSIS — W19XXXA Unspecified fall, initial encounter: Secondary | ICD-10-CM | POA: Diagnosis not present

## 2022-09-16 DIAGNOSIS — Z7982 Long term (current) use of aspirin: Secondary | ICD-10-CM | POA: Insufficient documentation

## 2022-09-16 DIAGNOSIS — R791 Abnormal coagulation profile: Secondary | ICD-10-CM | POA: Diagnosis not present

## 2022-09-16 DIAGNOSIS — R110 Nausea: Secondary | ICD-10-CM | POA: Diagnosis not present

## 2022-09-16 DIAGNOSIS — R0602 Shortness of breath: Secondary | ICD-10-CM | POA: Diagnosis not present

## 2022-09-16 DIAGNOSIS — R0689 Other abnormalities of breathing: Secondary | ICD-10-CM | POA: Diagnosis not present

## 2022-09-16 LAB — CBC WITH DIFFERENTIAL/PLATELET
Abs Immature Granulocytes: 0.14 10*3/uL — ABNORMAL HIGH (ref 0.00–0.07)
Basophils Absolute: 0 10*3/uL (ref 0.0–0.1)
Basophils Relative: 0 %
Eosinophils Absolute: 0.1 10*3/uL (ref 0.0–0.5)
Eosinophils Relative: 1 %
HCT: 34 % — ABNORMAL LOW (ref 36.0–46.0)
Hemoglobin: 11.2 g/dL — ABNORMAL LOW (ref 12.0–15.0)
Immature Granulocytes: 2 %
Lymphocytes Relative: 13 %
Lymphs Abs: 1 10*3/uL (ref 0.7–4.0)
MCH: 31.5 pg (ref 26.0–34.0)
MCHC: 32.9 g/dL (ref 30.0–36.0)
MCV: 95.8 fL (ref 80.0–100.0)
Monocytes Absolute: 0.6 10*3/uL (ref 0.1–1.0)
Monocytes Relative: 9 %
Neutro Abs: 5.6 10*3/uL (ref 1.7–7.7)
Neutrophils Relative %: 75 %
Platelets: 343 10*3/uL (ref 150–400)
RBC: 3.55 MIL/uL — ABNORMAL LOW (ref 3.87–5.11)
RDW: 12.8 % (ref 11.5–15.5)
WBC: 7.5 10*3/uL (ref 4.0–10.5)
nRBC: 0 % (ref 0.0–0.2)

## 2022-09-16 LAB — D-DIMER, QUANTITATIVE: D-Dimer, Quant: 2.66 ug/mL-FEU — ABNORMAL HIGH (ref 0.00–0.50)

## 2022-09-16 LAB — BASIC METABOLIC PANEL
Anion gap: 11 (ref 5–15)
BUN: 8 mg/dL (ref 8–23)
CO2: 23 mmol/L (ref 22–32)
Calcium: 8.9 mg/dL (ref 8.9–10.3)
Chloride: 105 mmol/L (ref 98–111)
Creatinine, Ser: 0.68 mg/dL (ref 0.44–1.00)
GFR, Estimated: >60 mL/min (ref >60)
Glucose, Bld: 122 mg/dL — ABNORMAL HIGH (ref 70–99)
Potassium: 3.3 mmol/L — ABNORMAL LOW (ref 3.5–5.1)
Sodium: 139 mmol/L (ref 135–145)

## 2022-09-16 LAB — URINALYSIS, ROUTINE W REFLEX MICROSCOPIC
Bilirubin Urine: NEGATIVE
Glucose, UA: NEGATIVE mg/dL
Hgb urine dipstick: NEGATIVE
Ketones, ur: 5 mg/dL — AB
Leukocytes,Ua: NEGATIVE
Nitrite: NEGATIVE
Protein, ur: NEGATIVE mg/dL
Specific Gravity, Urine: 1.005 (ref 1.005–1.030)
pH: 8 (ref 5.0–8.0)

## 2022-09-16 LAB — TROPONIN I (HIGH SENSITIVITY)
Troponin I (High Sensitivity): 11 ng/L (ref <18)
Troponin I (High Sensitivity): 13 ng/L (ref <18)

## 2022-09-16 MED ORDER — ONDANSETRON HCL 4 MG/2ML IJ SOLN
4.0000 mg | Freq: Once | INTRAMUSCULAR | Status: AC
  Administered 2022-09-16: 4 mg via INTRAVENOUS
  Filled 2022-09-16: qty 2

## 2022-09-16 MED ORDER — SODIUM CHLORIDE 0.9 % IV BOLUS
1000.0000 mL | Freq: Once | INTRAVENOUS | Status: AC
  Administered 2022-09-16: 1000 mL via INTRAVENOUS

## 2022-09-16 MED ORDER — IOHEXOL 350 MG/ML SOLN
75.0000 mL | Freq: Once | INTRAVENOUS | Status: AC | PRN
  Administered 2022-09-16: 75 mL via INTRAVENOUS

## 2022-09-16 MED ORDER — ONDANSETRON 4 MG PO TBDP: ORAL_TABLET | ORAL | 1 refills | Status: DC

## 2022-09-16 NOTE — ED Provider Triage Note (Signed)
Emergency Medicine Provider Triage Evaluation Note  Theresa Pierce , a 76 y.o. female  was evaluated in triage.  Pt complains of shortness of breath.  Review of Systems  Positive: Shortness of breath Negative: Chest pain  Physical Exam  BP (!) 187/64   Pulse 96   Temp 98.3 F (36.8 C) (Oral)   Resp 20   Ht 1.651 m ('5\' 5"'$ )   Wt 91.3 kg   SpO2 99%   BMI 33.48 kg/m  Gen:   Awake, anxious Resp:  Tachypneic  Medical Decision Making  Medically screening exam initiated at 6:38 AM.  Appropriate orders placed.  Theresa Pierce was informed that the remainder of the evaluation will be completed by another provider, this initial triage assessment does not replace that evaluation, and the importance of remaining in the ED until their evaluation is complete.     Ripley Fraise, MD 09/16/22 [phone]

## 2022-09-16 NOTE — ED Notes (Signed)
XR Completed

## 2022-09-16 NOTE — ED Provider Notes (Signed)
[facility] EMERGENCY DEPARTMENT Provider Note   CSN: [id number] Arrival date & time: 09/16/22  0541     History  Chief Complaint  Patient presents with   Shortness of Breath    Theresa Pierce is a 76 y.o. female.  Patient recently had hip replacement.  She complains of nausea and shortness of breath.  Patient has history of GERD  The history is provided by the patient and medical records.  Shortness of Breath Severity:  Mild Onset quality:  Sudden Timing:  Constant Progression:  Waxing and waning Chronicity:  New Context: activity   Relieved by:  Nothing Worsened by:  Nothing Ineffective treatments:  None tried Associated symptoms: no abdominal pain, no chest pain, no cough, no headaches and no rash        Home Medications Prior to Admission medications   Medication Sig Start Date End Date Taking? Authorizing Provider  ondansetron (ZOFRAN-ODT) 4 MG disintegrating tablet '4mg'$  ODT q4 hours prn nausea/vomit 09/16/22  Yes Milton Ferguson, MD  acetaminophen (TYLENOL) 500 MG tablet Take 1,000-1,500 mg by mouth every 6 (six) hours as needed (pain.).    [provider]  Ascorbic Acid (VITAMIN C) 1000 MG tablet Take 1,000 mg by mouth in the morning. With rose hips    [provider]  aspirin EC 325 MG tablet Take 1 tablet (325 mg total) by mouth 2 (two) times daily for 19 days. Then take one 81 mg aspirin once a day for three weeks. Then discontinue aspirin. 09/09/22 09/28/22  Edmisten, Ok Anis, PA  Biotin 1000 MCG tablet Take 1,000 mcg by mouth in the morning.    [provider]  Glucosamine HCl (GLUCOSAMINE PO) Take 1 tablet by mouth daily.    [provider]  HYDROcodone-acetaminophen (NORCO/VICODIN) 5-325 MG tablet Take 1-2 tablets by mouth every 6 (six) hours as needed for severe pain. 09/09/22   Edmisten, Kristie L, PA  levofloxacin (LEVAQUIN) 500 MG tablet Take 500 mg by mouth daily.    [provider]  methocarbamol  (ROBAXIN) 500 MG tablet Take 1 tablet (500 mg total) by mouth every 6 (six) hours as needed for muscle spasms. 09/09/22   Edmisten, Ok Anis, PA  omeprazole (PRILOSEC) 20 MG capsule Take 20 mg by mouth at bedtime.    [provider]  Polyethyl Glycol-Propyl Glycol (SYSTANE) 0.4-0.3 % SOLN Place 1-2 drops into both eyes 3 (three) times daily as needed (dry/irritated eyes.).    [provider]  traMADol (ULTRAM) 50 MG tablet Take 1-2 tablets (50-100 mg total) by mouth every 6 (six) hours as needed for moderate pain. 09/09/22   Edmisten, Ok Anis, PA      Allergies    Amoxicillin and Codeine    Review of Systems   Review of Systems  Constitutional:  Negative for appetite change and fatigue.  HENT:  Negative for congestion, ear discharge and sinus pressure.   Eyes:  Negative for discharge.  Respiratory:  Positive for shortness of breath. Negative for cough.   Cardiovascular:  Negative for chest pain.  Gastrointestinal:  Positive for nausea. Negative for abdominal pain and diarrhea.  Genitourinary:  Negative for frequency and hematuria.  Musculoskeletal:  Negative for back pain.  Skin:  Negative for rash.  Neurological:  Negative for seizures and headaches.  Psychiatric/Behavioral:  Negative for hallucinations.     Physical Exam Updated Vital Signs BP (!) 164/82   Pulse 93   Temp (!) 97.5 F (36.4 C) (Oral)   Resp 18  Ht '5\' 5"'$  (1.651 m)   Wt 91.3 kg   SpO2 98%   BMI 33.48 kg/m  Physical Exam Vitals and nursing note reviewed.  Constitutional:      Appearance: She is well-developed.  HENT:     Head: Normocephalic.     Nose: Nose normal.  Eyes:     General: No scleral icterus.    Conjunctiva/sclera: Conjunctivae normal.  Neck:     Thyroid: No thyromegaly.  Cardiovascular:     Rate and Rhythm: Normal rate and regular rhythm.     Heart sounds: No murmur heard.    No friction rub. No gallop.  Pulmonary:     Breath sounds: No stridor. No wheezing or  rales.     Comments: Mildly tachypneic Chest:     Chest wall: No tenderness.  Abdominal:     General: There is no distension.     Tenderness: There is no abdominal tenderness. There is no rebound.  Musculoskeletal:        General: Normal range of motion.     Cervical back: Neck supple.  Lymphadenopathy:     Cervical: No cervical adenopathy.  Skin:    Findings: No erythema or rash.  Neurological:     Mental Status: She is alert and oriented to person, place, and time.     Motor: No abnormal muscle tone.     Coordination: Coordination normal.  Psychiatric:        Behavior: Behavior normal.     ED Results / Procedures / Treatments   Labs (all labs ordered are listed, but only abnormal results are displayed) Labs Reviewed  CBC WITH DIFFERENTIAL/PLATELET - Abnormal; Notable for the following components:      Result Value   RBC 3.55 (*)    Hemoglobin 11.2 (*)    HCT 34.0 (*)    Abs Immature Granulocytes 0.14 (*)    All other components within normal limits  BASIC METABOLIC PANEL - Abnormal; Notable for the following components:   Potassium 3.3 (*)    Glucose, Bld 122 (*)    All other components within normal limits  D-DIMER, QUANTITATIVE - Abnormal; Notable for the following components:   D-Dimer, Quant 2.66 (*)    All other components within normal limits  URINALYSIS, ROUTINE W REFLEX MICROSCOPIC - Abnormal; Notable for the following components:   Color, Urine STRAW (*)    Ketones, ur 5 (*)    All other components within normal limits  TROPONIN I (HIGH SENSITIVITY)  TROPONIN I (HIGH SENSITIVITY)    EKG EKG Interpretation  Date/Time:  Friday September 16 2022 06:23:08 EST Ventricular Rate:  97 PR Interval:  158 QRS Duration: 86 QT Interval:  360 QTC Calculation: 458 R Axis:   58 Text Interpretation: Sinus rhythm No previous ECGs available Interpretation limited secondary to artifact Confirmed by Ripley Fraise [phone]) on 09/16/2022 6:38:09 AM  Radiology CT  Angio Chest PE W and/or Wo Contrast  Result Date: 09/16/2022 CLINICAL DATA:  Shortness of breath. EXAM: CT ANGIOGRAPHY CHEST WITH CONTRAST TECHNIQUE: Multidetector CT imaging of the chest was performed using the standard protocol during bolus administration of intravenous contrast. Multiplanar CT image reconstructions and MIPs were obtained to evaluate the vascular anatomy. RADIATION DOSE REDUCTION: This exam was performed according to the departmental dose-optimization program which includes automated exposure control, adjustment of the mA and/or kV according to patient size and/or use of iterative reconstruction technique. CONTRAST:  80m OMNIPAQUE IOHEXOL 350 MG/ML SOLN COMPARISON:  None Available. FINDINGS: Cardiovascular:  Satisfactory opacification of the pulmonary arteries to the segmental level. No evidence of pulmonary embolism. Normal heart size. No pericardial effusion. Mediastinum/Nodes: No enlarged mediastinal, hilar, or axillary lymph nodes. Thyroid gland, trachea, and esophagus demonstrate no significant findings. Lungs/Pleura: Lungs are clear. No pleural effusion or pneumothorax. Upper Abdomen: No acute abnormality. Musculoskeletal: No chest wall abnormality. No acute or significant osseous findings. Review of the MIP images confirms the above findings. IMPRESSION: No definite evidence of pulmonary embolus. No acute abnormality seen in the chest. Aortic Atherosclerosis (ICD10-I70.0). Electronically Signed   By: Marijo Conception M.D.   On: 09/16/2022 09:59   DG Chest Portable 1 View  Result Date: 09/16/2022 CLINICAL DATA:  76 year old female shortness of breath acute onset. Recent surgical hip replacement. EXAM: PORTABLE CHEST 1 VIEW COMPARISON:  Portable chest 08/18/2021. FINDINGS: Portable AP upright view at 0631 hours. Lung volumes and mediastinal contours remain normal. Visualized tracheal air column is within normal limits. Lung markings are stable. Allowing for portable technique the lungs  are clear. No pneumothorax or pleural effusion. Paucity of bowel gas in the upper abdomen. No acute osseous abnormality identified. IMPRESSION: Negative portable chest. Electronically Signed   By: Genevie Ann M.D.   On: 09/16/2022 07:14    Procedures Procedures    Medications Ordered in ED Medications  sodium chloride 0.9 % bolus 1,000 mL (1,000 mLs Intravenous New Bag/Given 09/16/22 0920)  ondansetron (ZOFRAN) injection 4 mg (4 mg Intravenous Given 09/16/22 0925)  iohexol (OMNIPAQUE) 350 MG/ML injection 75 mL (75 mLs Intravenous Contrast Given 09/16/22 0936)    ED Course/ Medical Decision Making/ A&P  Patient's status post hip replacement.  Labs EKG CT angio all unremarkable.  Patient improved with nausea medicine                         Medical Decision Making Amount and/or Complexity of Data Reviewed Labs: ordered. Radiology: ordered.  Risk Prescription drug management.  This patient presents to the ED for concern of nausea and shortness of breath, this involves an extensive number of treatment options, and is a complaint that carries with it a high risk of complications and morbidity.  The differential diagnosis includes PE, nausea from pain medicine   Co morbidities that complicate the patient evaluation  GERD, recent hip replacement   Additional history obtained:  Additional history obtained from family External records from outside source obtained and reviewed including hospital records   Lab Tests:  I Ordered, and personally interpreted labs.  The pertinent results include: White count normal, hemoglobin 11.2, D-dimer elevated 2.66   Imaging Studies ordered:  I ordered imaging studies including CT chest and chest x-ray I independently visualized and interpreted imaging which showed unremarkable I agree with the radiologist interpretation   Cardiac Monitoring: / EKG:  The patient was maintained on a cardiac monitor.  I personally viewed and interpreted the  cardiac monitored which showed an underlying rhythm of: Normal sinus rhythm   Consultations Obtained:  No consultant  Problem List / ED Course / Critical interventions / Medication management  GERD and recent hip replacement I ordered medication including Zofran for nausea Reevaluation of the patient after these medicines showed that the patient improved I have reviewed the patients home medicines and have made adjustments as needed   Social Determinants of Health:  None   Test / Admission - Considered:  None  Patient with shortness of breath most likely related to pain and nausea.  She has  improved with treatment and will follow-up with her orthopedic doctor next week        Final Clinical Impression(s) / ED Diagnoses Final diagnoses:  Dyspnea, unspecified type  Nausea    Rx / DC Orders ED Discharge Orders          Ordered    ondansetron (ZOFRAN-ODT) 4 MG disintegrating tablet        09/16/22 1051              Milton Ferguson, MD 09/17/22 [phone]

## 2022-09-16 NOTE — Discharge Instructions (Signed)
Follow-up with your orthopedic doctor as planned Tuesday.  Continue to try to stay hydrated

## 2022-09-16 NOTE — ED Triage Notes (Addendum)
Rcems from home. Cc of shortness of breath that started two hours after she taken a muscle relaxer. Got her hip replaced 11/15  Pt very anxious

## 2022-10-11 DIAGNOSIS — Z5189 Encounter for other specified aftercare: Secondary | ICD-10-CM | POA: Diagnosis not present

## 2022-11-15 DIAGNOSIS — M25561 Pain in right knee: Secondary | ICD-10-CM | POA: Diagnosis not present

## 2022-11-30 DIAGNOSIS — M25551 Pain in right hip: Secondary | ICD-10-CM | POA: Diagnosis not present

## 2022-11-30 DIAGNOSIS — R269 Unspecified abnormalities of gait and mobility: Secondary | ICD-10-CM | POA: Diagnosis not present

## 2022-12-01 DIAGNOSIS — M25551 Pain in right hip: Secondary | ICD-10-CM | POA: Diagnosis not present

## 2022-12-01 DIAGNOSIS — R269 Unspecified abnormalities of gait and mobility: Secondary | ICD-10-CM | POA: Diagnosis not present

## 2022-12-07 DIAGNOSIS — R269 Unspecified abnormalities of gait and mobility: Secondary | ICD-10-CM | POA: Diagnosis not present

## 2022-12-07 DIAGNOSIS — M25551 Pain in right hip: Secondary | ICD-10-CM | POA: Diagnosis not present

## 2022-12-08 DIAGNOSIS — M25551 Pain in right hip: Secondary | ICD-10-CM | POA: Diagnosis not present

## 2022-12-08 DIAGNOSIS — R269 Unspecified abnormalities of gait and mobility: Secondary | ICD-10-CM | POA: Diagnosis not present

## 2022-12-12 DIAGNOSIS — D3132 Benign neoplasm of left choroid: Secondary | ICD-10-CM | POA: Diagnosis not present

## 2022-12-12 DIAGNOSIS — H04123 Dry eye syndrome of bilateral lacrimal glands: Secondary | ICD-10-CM | POA: Diagnosis not present

## 2022-12-12 DIAGNOSIS — Z961 Presence of intraocular lens: Secondary | ICD-10-CM | POA: Diagnosis not present

## 2022-12-12 DIAGNOSIS — M25551 Pain in right hip: Secondary | ICD-10-CM | POA: Diagnosis not present

## 2022-12-12 DIAGNOSIS — H26491 Other secondary cataract, right eye: Secondary | ICD-10-CM | POA: Diagnosis not present

## 2022-12-12 DIAGNOSIS — H43813 Vitreous degeneration, bilateral: Secondary | ICD-10-CM | POA: Diagnosis not present

## 2022-12-12 DIAGNOSIS — R269 Unspecified abnormalities of gait and mobility: Secondary | ICD-10-CM | POA: Diagnosis not present

## 2022-12-16 DIAGNOSIS — R269 Unspecified abnormalities of gait and mobility: Secondary | ICD-10-CM | POA: Diagnosis not present

## 2022-12-16 DIAGNOSIS — M25551 Pain in right hip: Secondary | ICD-10-CM | POA: Diagnosis not present

## 2022-12-21 DIAGNOSIS — R269 Unspecified abnormalities of gait and mobility: Secondary | ICD-10-CM | POA: Diagnosis not present

## 2022-12-21 DIAGNOSIS — M25551 Pain in right hip: Secondary | ICD-10-CM | POA: Diagnosis not present

## 2022-12-28 DIAGNOSIS — R269 Unspecified abnormalities of gait and mobility: Secondary | ICD-10-CM | POA: Diagnosis not present

## 2022-12-28 DIAGNOSIS — M25551 Pain in right hip: Secondary | ICD-10-CM | POA: Diagnosis not present

## 2022-12-30 DIAGNOSIS — Z96641 Presence of right artificial hip joint: Secondary | ICD-10-CM | POA: Diagnosis not present

## 2022-12-30 DIAGNOSIS — M1712 Unilateral primary osteoarthritis, left knee: Secondary | ICD-10-CM | POA: Diagnosis not present

## 2023-01-10 DIAGNOSIS — L600 Ingrowing nail: Secondary | ICD-10-CM | POA: Diagnosis not present

## 2023-01-10 DIAGNOSIS — M79675 Pain in left toe(s): Secondary | ICD-10-CM | POA: Diagnosis not present

## 2023-01-11 ENCOUNTER — Other Ambulatory Visit: Payer: Self-pay

## 2023-01-11 ENCOUNTER — Observation Stay (HOSPITAL_COMMUNITY): Payer: Medicare Other

## 2023-01-11 ENCOUNTER — Encounter (HOSPITAL_COMMUNITY): Payer: Self-pay | Admitting: *Deleted

## 2023-01-11 ENCOUNTER — Observation Stay (HOSPITAL_COMMUNITY)
Admission: EM | Admit: 2023-01-11 | Discharge: 2023-01-12 | Disposition: A | Discharge status: Home Health | Payer: Medicare Other | Attending: Family Medicine | Admitting: Family Medicine

## 2023-01-11 ENCOUNTER — Emergency Department (HOSPITAL_COMMUNITY): Payer: Medicare Other

## 2023-01-11 DIAGNOSIS — Z79899 Other long term (current) drug therapy: Secondary | ICD-10-CM | POA: Insufficient documentation

## 2023-01-11 DIAGNOSIS — R9431 Abnormal electrocardiogram [ECG] [EKG]: Secondary | ICD-10-CM | POA: Diagnosis not present

## 2023-01-11 DIAGNOSIS — M1990 Unspecified osteoarthritis, unspecified site: Secondary | ICD-10-CM | POA: Insufficient documentation

## 2023-01-11 DIAGNOSIS — R413 Other amnesia: Secondary | ICD-10-CM | POA: Diagnosis not present

## 2023-01-11 DIAGNOSIS — R9082 White matter disease, unspecified: Secondary | ICD-10-CM | POA: Diagnosis not present

## 2023-01-11 DIAGNOSIS — R4182 Altered mental status, unspecified: Secondary | ICD-10-CM | POA: Insufficient documentation

## 2023-01-11 DIAGNOSIS — I739 Peripheral vascular disease, unspecified: Secondary | ICD-10-CM | POA: Insufficient documentation

## 2023-01-11 DIAGNOSIS — G454 Transient global amnesia: Secondary | ICD-10-CM | POA: Diagnosis not present

## 2023-01-11 DIAGNOSIS — Z96641 Presence of right artificial hip joint: Secondary | ICD-10-CM | POA: Insufficient documentation

## 2023-01-11 DIAGNOSIS — K219 Gastro-esophageal reflux disease without esophagitis: Secondary | ICD-10-CM | POA: Insufficient documentation

## 2023-01-11 LAB — DIFFERENTIAL
Abs Immature Granulocytes: 0.05 10*3/uL (ref 0.00–0.07)
Basophils Absolute: 0 10*3/uL (ref 0.0–0.1)
Basophils Relative: 1 %
Eosinophils Absolute: 0 10*3/uL (ref 0.0–0.5)
Eosinophils Relative: 0 %
Immature Granulocytes: 1 %
Lymphocytes Relative: 9 %
Lymphs Abs: 0.8 10*3/uL (ref 0.7–4.0)
Monocytes Absolute: 0.5 10*3/uL (ref 0.1–1.0)
Monocytes Relative: 6 %
Neutro Abs: 7.3 10*3/uL (ref 1.7–7.7)
Neutrophils Relative %: 83 %

## 2023-01-11 LAB — COMPREHENSIVE METABOLIC PANEL
ALT: 18 U/L (ref 0–44)
AST: 19 U/L (ref 15–41)
Albumin: 4.1 g/dL (ref 3.5–5.0)
Alkaline Phosphatase: 83 U/L (ref 38–126)
Anion gap: 8 (ref 5–15)
BUN: 14 mg/dL (ref 8–23)
CO2: 25 mmol/L (ref 22–32)
Calcium: 9.2 mg/dL (ref 8.9–10.3)
Chloride: 102 mmol/L (ref 98–111)
Creatinine, Ser: 0.75 mg/dL (ref 0.44–1.00)
GFR, Estimated: >60 mL/min (ref >60)
Glucose, Bld: 104 mg/dL — ABNORMAL HIGH (ref 70–99)
Potassium: 3.8 mmol/L (ref 3.5–5.1)
Sodium: 135 mmol/L (ref 135–145)
Total Bilirubin: 0.8 mg/dL (ref 0.3–1.2)
Total Protein: 7.4 g/dL (ref 6.5–8.1)

## 2023-01-11 LAB — CBC
HCT: 45.7 % (ref 36.0–46.0)
Hemoglobin: 15.1 g/dL — ABNORMAL HIGH (ref 12.0–15.0)
MCH: 31.3 pg (ref 26.0–34.0)
MCHC: 33 g/dL (ref 30.0–36.0)
MCV: 94.6 fL (ref 80.0–100.0)
Platelets: 174 10*3/uL (ref 150–400)
RBC: 4.83 MIL/uL (ref 3.87–5.11)
RDW: 12.9 % (ref 11.5–15.5)
WBC: 8.7 10*3/uL (ref 4.0–10.5)
nRBC: 0 % (ref 0.0–0.2)

## 2023-01-11 LAB — I-STAT CHEM 8, ED
BUN: 14 mg/dL (ref 8–23)
Calcium, Ion: 1.21 mmol/L (ref 1.15–1.40)
Chloride: 102 mmol/L (ref 98–111)
Creatinine, Ser: 0.7 mg/dL (ref 0.44–1.00)
Glucose, Bld: 103 mg/dL — ABNORMAL HIGH (ref 70–99)
HCT: 48 % — ABNORMAL HIGH (ref 36.0–46.0)
Hemoglobin: 16.3 g/dL — ABNORMAL HIGH (ref 12.0–15.0)
Potassium: 4 mmol/L (ref 3.5–5.1)
Sodium: 140 mmol/L (ref 135–145)
TCO2: 25 mmol/L (ref 22–32)

## 2023-01-11 LAB — APTT: aPTT: 24 seconds (ref 24–36)

## 2023-01-11 LAB — RAPID URINE DRUG SCREEN, HOSP PERFORMED
Amphetamines: NOT DETECTED
Barbiturates: NOT DETECTED
Benzodiazepines: NOT DETECTED
Cocaine: NOT DETECTED
Opiates: NOT DETECTED
Tetrahydrocannabinol: NOT DETECTED

## 2023-01-11 LAB — PROTIME-INR
INR: 1 (ref 0.8–1.2)
Prothrombin Time: 13.3 seconds (ref 11.4–15.2)

## 2023-01-11 LAB — ETHANOL: Alcohol, Ethyl (B): <10 mg/dL (ref <10)

## 2023-01-11 MED ORDER — SODIUM CHLORIDE 0.9 % IV BOLUS
500.0000 mL | Freq: Once | INTRAVENOUS | Status: AC
  Administered 2023-01-11: 500 mL via INTRAVENOUS

## 2023-01-11 MED ORDER — LORAZEPAM 2 MG/ML IJ SOLN
0.5000 mg | Freq: Once | INTRAMUSCULAR | Status: AC
  Administered 2023-01-11: 0.5 mg via INTRAVENOUS
  Filled 2023-01-11: qty 1

## 2023-01-11 MED ORDER — ASPIRIN 325 MG PO TABS
325.0000 mg | ORAL_TABLET | Freq: Once | ORAL | Status: AC
  Administered 2023-01-11: 325 mg via ORAL
  Filled 2023-01-11: qty 1

## 2023-01-11 MED ORDER — ACETAMINOPHEN 325 MG PO TABS: 650.0000 mg | ORAL_TABLET | ORAL | Status: DC | PRN

## 2023-01-11 MED ORDER — LABETALOL HCL 5 MG/ML IV SOLN: 10.0000 mg | INTRAVENOUS | Status: DC | PRN

## 2023-01-11 MED ORDER — ENOXAPARIN SODIUM 40 MG/0.4ML IJ SOSY
40.0000 mg | PREFILLED_SYRINGE | INTRAMUSCULAR | Status: DC
  Administered 2023-01-11: 40 mg via SUBCUTANEOUS
  Filled 2023-01-11: qty 0.4

## 2023-01-11 MED ORDER — SENNOSIDES-DOCUSATE SODIUM 8.6-50 MG PO TABS: 1.0000 | ORAL_TABLET | Freq: Every evening | ORAL | Status: DC | PRN

## 2023-01-11 MED ORDER — ACETAMINOPHEN 650 MG RE SUPP: 650.0000 mg | RECTAL | Status: DC | PRN

## 2023-01-11 MED ORDER — SODIUM CHLORIDE 0.9 % IV SOLN
100.0000 mL/h | INTRAVENOUS | Status: DC
  Administered 2023-01-11: 100 mL/h via INTRAVENOUS

## 2023-01-11 MED ORDER — STROKE: EARLY STAGES OF RECOVERY BOOK: Freq: Once | Status: AC

## 2023-01-11 MED ORDER — ACETAMINOPHEN 160 MG/5ML PO SOLN: 650.0000 mg | ORAL | Status: DC | PRN
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
## 2023-01-11 NOTE — ED Notes (Signed)
Pt gone to CT 

## 2023-01-11 NOTE — ED Provider Notes (Signed)
[facility] Provider Note   CSN: [id number] Arrival date & time: 01/11/23  1250     History {Add pertinent medical, surgical, social history, OB history to HPI:1} Chief Complaint  Patient presents with   Altered Mental Status    Theresa Pierce is a 77 y.o. female.   Altered Mental Status    Patient has a history of reflux, peripheral vascular disease, arthritis.  She presents to the ED for evaluation of amnesia.  Patient's last time known to be well was last evening.  Patient is here with her daughter.  Patient apparently got up this morning and got his medications ready..  Unknown if the patient was having any memory issues at that time.  When the family talk to her later this morning patient did not have any memory of several events.  For example she was not aware she has had certain surgical procedures.  Patient does recognize her family members.  She only is aware of her name and her birthday.  She denies any pain.  No trouble with her speech balance or coordination.  No visual disturbance  Home Medications Prior to Admission medications   Medication Sig Start Date End Date Taking? Authorizing Provider  acetaminophen (TYLENOL) 500 MG tablet Take 1,000-1,500 mg by mouth every 6 (six) hours as needed (pain.).    [provider]  Ascorbic Acid (VITAMIN C) 1000 MG tablet Take 1,000 mg by mouth in the morning. With rose hips    [provider]  Biotin 1000 MCG tablet Take 1,000 mcg by mouth in the morning.    [provider]  Glucosamine HCl (GLUCOSAMINE PO) Take 1 tablet by mouth daily.    [provider]  HYDROcodone-acetaminophen (NORCO/VICODIN) 5-325 MG tablet Take 1-2 tablets by mouth every 6 (six) hours as needed for severe pain. 09/09/22   Edmisten, Kristie L, PA  levofloxacin (LEVAQUIN) 500 MG tablet Take 500 mg by mouth daily.    [provider]  methocarbamol (ROBAXIN) 500 MG tablet  Take 1 tablet (500 mg total) by mouth every 6 (six) hours as needed for muscle spasms. 09/09/22   Edmisten, Ok Anis, PA  omeprazole (PRILOSEC) 20 MG capsule Take 20 mg by mouth at bedtime.    [provider]  ondansetron (ZOFRAN-ODT) 4 MG disintegrating tablet 4mg  ODT q4 hours prn nausea/vomit 09/16/22   Milton Ferguson, MD  Polyethyl Glycol-Propyl Glycol (SYSTANE) 0.4-0.3 % SOLN Place 1-2 drops into both eyes 3 (three) times daily as needed (dry/irritated eyes.).    [provider]  traMADol (ULTRAM) 50 MG tablet Take 1-2 tablets (50-100 mg total) by mouth every 6 (six) hours as needed for moderate pain. 09/09/22   Edmisten, Ok Anis, PA      Allergies    Amoxicillin and Codeine    Review of Systems   Review of Systems  Physical Exam Updated Vital Signs Ht 1.676 m (5\' 6" )   Wt 91.3 kg   BMI 32.49 kg/m  Physical Exam Vitals and nursing note reviewed.  Constitutional:      General: She is not in acute distress.    Appearance: She is well-developed.  HENT:     Head: Normocephalic and atraumatic.     Right Ear: External ear normal.     Left Ear: External ear normal.  Eyes:     General: No visual field deficit or scleral icterus.       Right eye: No discharge.  Left eye: No discharge.     Conjunctiva/sclera: Conjunctivae normal.  Neck:     Trachea: No tracheal deviation.  Cardiovascular:     Rate and Rhythm: Normal rate and regular rhythm.  Pulmonary:     Effort: Pulmonary effort is normal. No respiratory distress.     Breath sounds: Normal breath sounds. No stridor. No wheezing or rales.  Abdominal:     General: Bowel sounds are normal. There is no distension.     Palpations: Abdomen is soft.     Tenderness: There is no abdominal tenderness. There is no guarding or rebound.  Musculoskeletal:        General: No tenderness.     Cervical back: Neck supple.  Skin:    General: Skin is warm and dry.     Findings: No rash.  Neurological:     Mental  Status: She is alert and oriented to person, place, and time.     GCS: GCS eye subscore is 4. GCS verbal subscore is 4. GCS motor subscore is 6.     Cranial Nerves: No cranial nerve deficit, dysarthria or facial asymmetry.     Sensory: No sensory deficit.     Motor: No abnormal muscle tone, seizure activity or pronator drift.     Coordination: Coordination normal.     Comments:  able to hold both legs off bed for 5 seconds, sensation intact in all extremities,  no left or right sided neglect, normal finger-nose exam bilaterally, no nystagmus noted  Impaired memory.  Patient did not recall surgical procedures that she has had in the past  Psychiatric:        Mood and Affect: Mood normal.     ED Results / Procedures / Treatments   Labs (all labs ordered are listed, but only abnormal results are displayed) Labs Reviewed - No data to display  EKG None  Radiology No results found.  Procedures Procedures  {Document cardiac monitor, telemetry assessment procedure when appropriate:1}  Medications Ordered in ED Medications  sodium chloride 0.9 % bolus 500 mL (has no administration in time range)    Followed by  0.9 %  sodium chloride infusion (has no administration in time range)    ED Course/ Medical Decision Making/ A&P   {   Click here for ABCD2, HEART and other calculatorsREFRESH Note before signing :1}                          Medical Decision Making Amount and/or Complexity of Data Reviewed Labs: ordered. Radiology: ordered.  Risk Prescription drug management.   ***  {Document critical care time when appropriate:1} {Document review of labs and clinical decision tools ie heart score, Chads2Vasc2 etc:1}  {Document your independent review of radiology images, and any outside records:1} {Document your discussion with family members, caretakers, and with consultants:1} {Document social determinants of health affecting pt's care:1} {Document your decision making why or  why not admission, treatments were needed:1} Final Clinical Impression(s) / ED Diagnoses Final diagnoses:  None    Rx / DC Orders ED Discharge Orders     None

## 2023-01-11 NOTE — Assessment & Plan Note (Addendum)
Selective, appears more of her short-term memory affected than her long-term memory.  No other focal neurologic deficits.  History of stroke.  Not on aspirin.  Differentials include transient global ischemia, need to rule out stroke. - EDP to Dr. Cheral Marker, recommended MRI, EEG, okay to admit here. -Passed bedside swallow eval -Permissive hypertension, IV labetalol for systolic greater than [id number] -Aspirin 325 x 1 -Lipid panel, A1c in a.m. -Further stroke workup pending MRI findings

## 2023-01-11 NOTE — ED Triage Notes (Signed)
Pt brought in by family members because they state pt is unable to remember anything before today;   Pt is only oriented to name and birthday  Pt denies any pain, dizziness  Pt's spouse said pt got up this am and got his pills ready and states pt was c/o heartburn and took an omeprazole on an empty stomach

## 2023-01-12 ENCOUNTER — Observation Stay (HOSPITAL_COMMUNITY)
Admit: 2023-01-12 | Discharge: 2023-01-12 | Disposition: A | Discharge status: Home / Self Care | Payer: Medicare Other | Attending: Internal Medicine | Admitting: Internal Medicine

## 2023-01-12 DIAGNOSIS — R4182 Altered mental status, unspecified: Secondary | ICD-10-CM

## 2023-01-12 DIAGNOSIS — R413 Other amnesia: Secondary | ICD-10-CM | POA: Diagnosis not present

## 2023-01-12 LAB — LIPID PANEL
Cholesterol: 145 mg/dL (ref 0–200)
HDL: 53 mg/dL (ref >40)
LDL Cholesterol: 84 mg/dL (ref 0–99)
Total CHOL/HDL Ratio: 2.7 RATIO
Triglycerides: 40 mg/dL (ref <150)
VLDL: 8 mg/dL (ref 0–40)

## 2023-01-12 LAB — HEMOGLOBIN A1C
Hgb A1c MFr Bld: 5.8 % — ABNORMAL HIGH (ref 4.8–5.6)
Mean Plasma Glucose: 120 mg/dL

## 2023-01-12 MED ORDER — ATORVASTATIN CALCIUM 20 MG PO TABS: 20.0000 mg | ORAL_TABLET | Freq: Every day | ORAL | 11 refills | Status: DC

## 2023-01-12 MED ORDER — ACETAMINOPHEN 325 MG PO TABS: 650.0000 mg | ORAL_TABLET | ORAL | 1 refills | Status: AC | PRN

## 2023-01-12 MED ORDER — ASPIRIN 81 MG PO TBEC: 81.0000 mg | DELAYED_RELEASE_TABLET | Freq: Every day | ORAL | 2 refills | Status: AC

## 2023-01-12 MED ORDER — AMLODIPINE BESYLATE 5 MG PO TABS: 5.0000 mg | ORAL_TABLET | Freq: Every day | ORAL | 11 refills | Status: DC

## 2023-01-12 NOTE — Procedures (Signed)
Patient Name: Theresa Pierce  MRN: [id number]  Epilepsy Attending: Lora Havens  Referring Physician/Provider: Bethena Roys, MD  Date: 01/12/2023 Duration: 24.32 mins  Patient history: 77yo F with ams. EEG to evaluate for seizure  Level of alertness: Awake  AEDs during EEG study: None  Technical aspects: This EEG study was done with scalp electrodes positioned according to the 10-20 International system of electrode placement. Electrical activity was reviewed with band pass filter of 1-70Hz , sensitivity of 7 uV/mm, display speed of 10mm/sec with a 60Hz  notched filter applied as appropriate. EEG data were recorded continuously and digitally stored.  Video monitoring was available and reviewed as appropriate.  Description: The posterior dominant rhythm consists of 10 Hz activity of moderate voltage (25-35 uV) seen predominantly in posterior head regions, symmetric and reactive to eye opening and eye closing. Hyperventilation and photic stimulation were not performed.     IMPRESSION: This study is within normal limits. No seizures or epileptiform discharges were seen throughout the recording.  A normal interictal EEG does not exclude the diagnosis of epilepsy.  [name] Barbra Sarks

## 2023-01-12 NOTE — Care Management Obs Status (Signed)
[facility] NOTIFICATION   Patient Details  Name: Theresa Pierce MRN: [id number] Date of Birth: 1946-08-16   Medicare Observation Status Notification Given:  Yes    Shade Flood, LCSW 01/12/2023, 2:59 PM
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
## 2023-01-12 NOTE — TOC Progression Note (Signed)
  Transition of Care [facility]) Screening Note   Patient Details  Name: Theresa Pierce Date of Birth: 1945/11/09   [facility] CM/SW Contact:    Shade Flood, LCSW Phone Number: 01/12/2023, 2:59 PM    Transition of Care Department [facility]) has reviewed patient and no TOC needs have been identified at this time. We will continue to monitor patient advancement through interdisciplinary progression rounds. If new patient transition needs arise, please place a TOC consult.

## 2023-01-12 NOTE — Discharge Instructions (Signed)
1) please note the changes to your medications 2) please follow-up with neurologist as outpatient as advised

## 2023-01-12 NOTE — Progress Notes (Signed)
EEG complete - results pending 

## 2023-01-13 NOTE — TOC Transition Note (Signed)
Transition of Care [facility]) - CM/SW Discharge Note   Patient Details  Name: Theresa Pierce MRN: [id number] Date of Birth: [birth year]  Transition of Care [facility]) CM/SW Contact:  Shade Flood, LCSW Phone Number: 01/13/2023, 8:58 AM   Clinical Narrative:       Pt was discharged home yesterday after TOC had left for the day. MD had ordered HHPT. Spoke with pt by phone today to review. Per pt, she is getting ready to go to work and she does not feel that she needs HHPT.      Patient Goals and CMS Choice      Discharge Placement                         Discharge Plan and Services Additional resources added to the After Visit Summary for   In-house Referral: Clinical Social Work                        HH Arranged: Refused HH          Social Determinants of Health (SDOH) Interventions SDOH Screenings   Food Insecurity: No Food Insecurity (01/11/2023)  Housing: Low Risk  (01/11/2023)  Transportation Needs: No Transportation Needs (01/11/2023)  Utilities: Not At Risk (01/11/2023)  Tobacco Use: Low Risk  (01/11/2023)     Readmission Risk Interventions     No data to display

## 2023-01-16 DIAGNOSIS — E663 Overweight: Secondary | ICD-10-CM | POA: Diagnosis not present

## 2023-01-16 DIAGNOSIS — Z6829 Body mass index (BMI) 29.0-29.9, adult: Secondary | ICD-10-CM | POA: Diagnosis not present

## 2023-01-16 DIAGNOSIS — G459 Transient cerebral ischemic attack, unspecified: Secondary | ICD-10-CM | POA: Diagnosis not present

## 2023-01-23 ENCOUNTER — Other Ambulatory Visit (HOSPITAL_COMMUNITY): Payer: Self-pay | Admitting: Internal Medicine

## 2023-01-23 DIAGNOSIS — J0100 Acute maxillary sinusitis, unspecified: Secondary | ICD-10-CM | POA: Diagnosis not present

## 2023-01-23 DIAGNOSIS — M1991 Primary osteoarthritis, unspecified site: Secondary | ICD-10-CM | POA: Diagnosis not present

## 2023-01-23 DIAGNOSIS — J209 Acute bronchitis, unspecified: Secondary | ICD-10-CM | POA: Diagnosis not present

## 2023-01-23 DIAGNOSIS — Z6828 Body mass index (BMI) 28.0-28.9, adult: Secondary | ICD-10-CM | POA: Diagnosis not present

## 2023-01-23 DIAGNOSIS — E6609 Other obesity due to excess calories: Secondary | ICD-10-CM | POA: Diagnosis not present

## 2023-01-23 DIAGNOSIS — G459 Transient cerebral ischemic attack, unspecified: Secondary | ICD-10-CM | POA: Diagnosis not present

## 2023-01-23 DIAGNOSIS — I700 Atherosclerosis of aorta: Secondary | ICD-10-CM | POA: Diagnosis not present

## 2023-01-23 DIAGNOSIS — I10 Essential (primary) hypertension: Secondary | ICD-10-CM | POA: Diagnosis not present

## 2023-01-26 ENCOUNTER — Encounter: Payer: Self-pay | Admitting: Diagnostic Neuroimaging

## 2023-01-26 ENCOUNTER — Ambulatory Visit (INDEPENDENT_AMBULATORY_CARE_PROVIDER_SITE_OTHER): Payer: Medicare Other | Admitting: Diagnostic Neuroimaging

## 2023-01-26 VITALS — BP 167/88 | HR 70 | Ht 66.0 in | Wt 181.0 lb

## 2023-01-26 DIAGNOSIS — G454 Transient global amnesia: Secondary | ICD-10-CM | POA: Diagnosis not present

## 2023-01-26 NOTE — Progress Notes (Signed)
[facility]  PATIENT: Theresa Pierce DOB: 11/25/1945  REFERRING CLINICIAN: Elfredia Nevins, MD HISTORY FROM: patient  REASON FOR VISIT: new consult   HISTORICAL  CHIEF COMPLAINT:  Chief Complaint  Patient presents with   New Patient (Initial Visit)    Patient in room #7 and alone. Patient states she would like to discuss her Amnesia.    HISTORY OF PRESENT ILLNESS:   77 year old female here for evaluation of transient confusion/amnesia.  Patient presented to hospital on 01/11/2023 only able to recognize family, her own name and her birthday.  She was unable to remember recent events such as hip surgery in November 2024, which she had for dinner the night before, the president or the year.  Patient was repetitive.  She went to the emergency room for evaluation.  MRI and EEG obtained.  Her blood pressure was elevated with systolic ranging 528-413.  Symptoms lasted about 24 hours and then gradually resolved.  She is back to baseline.  She was diagnosed with TIA versus transient global amnesia.    REVIEW OF SYSTEMS: Full 14 system review of systems performed and negative with exception of: as per HPI.  ALLERGIES: Allergies  Allergen Reactions   Amoxicillin Other (See Comments)    Did it involve swelling of the face/tongue/throat, SOB, or low BP? No Did it involve sudden or severe rash/hives, skin peeling, or any reaction on the inside of your mouth or nose? No Did you need to seek medical attention at a hospital or doctor's office? Yes When did it last happen?   Her legs burned    If all above answers are "NO", may proceed with cephalosporin use.    Codeine Itching    HOME MEDICATIONS: Outpatient Medications Prior to Visit  Medication Sig Dispense Refill   acetaminophen (TYLENOL) 325 MG tablet Take 2 tablets (650 mg total) by mouth every 4 (four) hours as needed for mild pain (or temp > 37.5 C (99.5 F)). 100 tablet 1   Ascorbic Acid (VITAMIN C) 1000 MG  tablet Take 1,000 mg by mouth daily.     aspirin EC 81 MG tablet Take 1 tablet (81 mg total) by mouth daily with breakfast. 30 tablet 2   Biotin 1000 MCG CHEW Chew 1,000 mg by mouth daily.     cephALEXin (KEFLEX) 500 MG capsule Take 500 mg by mouth every 6 (six) hours.     doxycycline (ADOXA) 100 MG tablet Take 100 mg by mouth 2 (two) times daily.     methylPREDNISolone (MEDROL DOSEPAK) 4 MG TBPK tablet Take 4 mg by mouth as directed.     omeprazole (PRILOSEC OTC) 20 MG tablet Take 20 mg by mouth daily.     amLODipine (NORVASC) 5 MG tablet Take 1 tablet (5 mg total) by mouth daily. 30 tablet 11   atorvastatin (LIPITOR) 20 MG tablet Take 1 tablet (20 mg total) by mouth daily. 30 tablet 11   No facility-administered medications prior to visit.    PAST MEDICAL HISTORY: Past Medical History:  Diagnosis Date   Arthritis    hips and knees   Broken ankle    numbness in foot   GERD (gastroesophageal reflux disease)    Peripheral vascular disease     PAST SURGICAL HISTORY: Past Surgical History:  Procedure Laterality Date   CATARACT EXTRACTION Bilateral 2018   I & D EXTREMITY Left 03/12/2019   Procedure: IRRIGATION AND DEBRIDEMENT EXTREMITY;  Surgeon: Dominica Severin, MD;  Location: [facility] OR;  Service: Orthopedics;  Laterality: Left;   laser ablations bilat GSV 2012 Bilateral 2012   leg   TONSILLECTOMY     3rd grade   TOTAL HIP ARTHROPLASTY Right 09/07/2022   Procedure: TOTAL HIP ARTHROPLASTY ANTERIOR APPROACH;  Surgeon: Ollen Gross, MD;  Location: [facility];  Service: Orthopedics;  Laterality: Right;    FAMILY HISTORY: Family History  Problem Relation Age of Onset   Heart disease Other    Cancer Other    Arthritis Other     SOCIAL HISTORY: Social History   Socioeconomic History   Marital status: Married    Spouse name: Not on file   Number of children: Not on file   Years of education: Not on file   Highest education level: Not on file  Occupational History   Not on file   Tobacco Use   Smoking status: Never   Smokeless tobacco: Never  Vaping Use   Vaping Use: Not on file  Substance and Sexual Activity   Alcohol use: No   Drug use: No   Sexual activity: Not on file  Other Topics Concern   Not on file  Social History Narrative   Not on file   Social Determinants of Health   Financial Resource Strain: Not on file  Food Insecurity: No Food Insecurity (01/11/2023)   Hunger Vital Sign    Worried About Running Out of Food in the Last Year: Never true    Ran Out of Food in the Last Year: Never true  Transportation Needs: No Transportation Needs (01/11/2023)   PRAPARE - Administrator, Civil Service (Medical): No    Lack of Transportation (Non-Medical): No  Physical Activity: Not on file  Stress: Not on file  Social Connections: Not on file  Intimate Partner Violence: Not At Risk (01/11/2023)   Humiliation, Afraid, Rape, and Kick questionnaire    Fear of Current or Ex-Partner: No    Emotionally Abused: No    Physically Abused: No    Sexually Abused: No     PHYSICAL EXAM  GENERAL EXAM/CONSTITUTIONAL: Vitals:  Vitals:   01/26/23 1446  BP: (!) 167/88  Pulse: 70  Weight: 181 lb (82.1 kg)  Height:  (1.676 m)   Body mass index is 29.21 kg/m. Wt Readings from Last 3 Encounters:  01/26/23 181 lb (82.1 kg)  01/11/23 201 lb 4.5 oz (91.3 kg)  09/16/22 201 lb 3.3 oz (91.3 kg)   Patient is in no distress; well developed, nourished and groomed; neck is supple  CARDIOVASCULAR: Examination of carotid arteries is normal; no carotid bruits Regular rate and rhythm, no murmurs Examination of peripheral vascular system by observation and palpation is normal  EYES: Ophthalmoscopic exam of optic discs and posterior segments is normal; no papilledema or hemorrhages No results found.  MUSCULOSKELETAL: Gait, strength, tone, movements noted in Neurologic exam below  NEUROLOGIC: MENTAL STATUS:      No data to display          awake, alert, oriented to person, place and time recent and remote memory intact normal attention and concentration language fluent, comprehension intact, naming intact fund of knowledge appropriate  CRANIAL NERVE:  2nd - no papilledema on fundoscopic exam 2nd, 3rd, 4th, 6th - pupils equal and reactive to light, visual fields full to confrontation, extraocular muscles intact, no nystagmus 5th - facial sensation symmetric 7th - facial strength symmetric 8th - hearing intact 9th - palate elevates symmetrically, uvula midline 11th - shoulder shrug symmetric 12th - tongue protrusion midline  MOTOR:  normal bulk and tone, full strength in the BUE, BLE  SENSORY:  normal and symmetric to light touch, temperature, vibration  COORDINATION:  finger-nose-finger, fine finger movements normal  REFLEXES:  deep tendon reflexes present and symmetric  GAIT/STATION:  narrow based gait    DIAGNOSTIC DATA (LABS, IMAGING, TESTING) - I reviewed patient records, labs, notes, testing and imaging myself where available.  Lab Results  Component Value Date   WBC 8.7 01/11/2023   HGB 16.3 (H) 01/11/2023   HCT 48.0 (H) 01/11/2023   MCV 94.6 01/11/2023   PLT 174 01/11/2023      Component Value Date/Time   NA 140 01/11/2023 1409   K 4.0 01/11/2023 1409   CL 102 01/11/2023 1409   CO2 25 01/11/2023 1403   GLUCOSE 103 (H) 01/11/2023 1409   BUN 14 01/11/2023 1409   CREATININE 0.70 01/11/2023 1409   CALCIUM 9.2 01/11/2023 1403   PROT 7.4 01/11/2023 1403   ALBUMIN 4.1 01/11/2023 1403   AST 19 01/11/2023 1403   ALT 18 01/11/2023 1403   ALKPHOS 83 01/11/2023 1403   BILITOT 0.8 01/11/2023 1403   GFRNONAA >60 01/11/2023 1403   GFRAA >60 03/12/2019 1734   Lab Results  Component Value Date   CHOL 145 01/12/2023   HDL 53 01/12/2023   LDLCALC 84 01/12/2023   TRIG 40 01/12/2023   CHOLHDL 2.7 01/12/2023   Lab Results  Component Value Date   HGBA1C 5.8 (H) 01/11/2023   No results found  for: "VITAMINB12" No results found for: "TSH"   01/11/23 MRI brain 1. No acute intracranial abnormality. 2. 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  01/12/23 EEG - This study is within normal limits. No seizures or epileptiform discharges were seen throughout the recording. - A normal interictal EEG does not exclude the diagnosis of epilepsy.    ASSESSMENT AND PLAN  77 y.o. year old female here with:   Dx:  1. TGA (transient global amnesia)     PLAN:  TRANSIENT GLOBAL AMNESIA - now back to baseline; neuro exam, MRI, EEG are normal; was under high stress and having high BP leading up to event - continue BP control; continue supportive care  Return for return to PCP, pending if symptoms worsen or fail to improve.    Suanne Marker, MD 01/26/2023, 3:22 PM Certified in Neurology, Neurophysiology and Neuroimaging  [facility] [address] [place], [place] [address] [phone]

## 2023-01-26 NOTE — Patient Instructions (Signed)
TRANSIENT GLOBAL AMNESIA - now back to baseline; neuro exam, MRI, EEG are normal; was under high stress and having high BP leading up to event - continue BP control; continue supportive care

## 2023-01-30 ENCOUNTER — Ambulatory Visit (HOSPITAL_COMMUNITY)
Admission: RE | Admit: 2023-01-30 | Discharge: 2023-01-30 | Disposition: A | Discharge status: Home / Self Care | Payer: Medicare Other | Source: Ambulatory Visit | Attending: Internal Medicine | Admitting: Internal Medicine

## 2023-01-30 DIAGNOSIS — G459 Transient cerebral ischemic attack, unspecified: Secondary | ICD-10-CM | POA: Insufficient documentation

## 2023-01-30 DIAGNOSIS — I6523 Occlusion and stenosis of bilateral carotid arteries: Secondary | ICD-10-CM | POA: Diagnosis not present

## 2023-03-23 DIAGNOSIS — Z96641 Presence of right artificial hip joint: Secondary | ICD-10-CM | POA: Diagnosis not present

## 2023-07-28 DIAGNOSIS — Z23 Encounter for immunization: Secondary | ICD-10-CM | POA: Diagnosis not present

## 2023-07-28 DIAGNOSIS — I700 Atherosclerosis of aorta: Secondary | ICD-10-CM | POA: Diagnosis not present

## 2023-07-28 DIAGNOSIS — N390 Urinary tract infection, site not specified: Secondary | ICD-10-CM | POA: Diagnosis not present

## 2023-07-28 DIAGNOSIS — I10 Essential (primary) hypertension: Secondary | ICD-10-CM | POA: Diagnosis not present

## 2023-07-28 DIAGNOSIS — Z6829 Body mass index (BMI) 29.0-29.9, adult: Secondary | ICD-10-CM | POA: Diagnosis not present

## 2023-07-28 DIAGNOSIS — N3001 Acute cystitis with hematuria: Secondary | ICD-10-CM | POA: Diagnosis not present

## 2023-09-07 DIAGNOSIS — M7062 Trochanteric bursitis, left hip: Secondary | ICD-10-CM | POA: Diagnosis not present

## 2023-09-07 DIAGNOSIS — M545 Low back pain, unspecified: Secondary | ICD-10-CM | POA: Diagnosis not present

## 2023-09-07 DIAGNOSIS — Z96641 Presence of right artificial hip joint: Secondary | ICD-10-CM | POA: Diagnosis not present

## 2023-10-03 DIAGNOSIS — M79675 Pain in left toe(s): Secondary | ICD-10-CM | POA: Diagnosis not present

## 2023-10-03 DIAGNOSIS — M2042 Other hammer toe(s) (acquired), left foot: Secondary | ICD-10-CM | POA: Diagnosis not present

## 2023-10-03 DIAGNOSIS — L97522 Non-pressure chronic ulcer of other part of left foot with fat layer exposed: Secondary | ICD-10-CM | POA: Diagnosis not present

## 2023-10-05 DIAGNOSIS — I700 Atherosclerosis of aorta: Secondary | ICD-10-CM | POA: Diagnosis not present

## 2023-10-05 DIAGNOSIS — I10 Essential (primary) hypertension: Secondary | ICD-10-CM | POA: Diagnosis not present

## 2023-10-05 DIAGNOSIS — U071 COVID-19: Secondary | ICD-10-CM | POA: Diagnosis not present

## 2023-10-10 DIAGNOSIS — L97529 Non-pressure chronic ulcer of other part of left foot with unspecified severity: Secondary | ICD-10-CM | POA: Diagnosis not present

## 2023-11-07 DIAGNOSIS — M79675 Pain in left toe(s): Secondary | ICD-10-CM | POA: Diagnosis not present

## 2023-11-07 DIAGNOSIS — L97522 Non-pressure chronic ulcer of other part of left foot with fat layer exposed: Secondary | ICD-10-CM | POA: Diagnosis not present

## 2023-11-07 DIAGNOSIS — M2042 Other hammer toe(s) (acquired), left foot: Secondary | ICD-10-CM | POA: Diagnosis not present

## 2023-11-28 DIAGNOSIS — M2042 Other hammer toe(s) (acquired), left foot: Secondary | ICD-10-CM | POA: Diagnosis not present

## 2023-11-28 DIAGNOSIS — L84 Corns and callosities: Secondary | ICD-10-CM | POA: Diagnosis not present

## 2023-12-19 DIAGNOSIS — H26491 Other secondary cataract, right eye: Secondary | ICD-10-CM | POA: Diagnosis not present

## 2023-12-19 DIAGNOSIS — H04123 Dry eye syndrome of bilateral lacrimal glands: Secondary | ICD-10-CM | POA: Diagnosis not present

## 2023-12-19 DIAGNOSIS — Z961 Presence of intraocular lens: Secondary | ICD-10-CM | POA: Diagnosis not present

## 2023-12-19 DIAGNOSIS — D3132 Benign neoplasm of left choroid: Secondary | ICD-10-CM | POA: Diagnosis not present

## 2023-12-29 DIAGNOSIS — M170 Bilateral primary osteoarthritis of knee: Secondary | ICD-10-CM | POA: Diagnosis not present

## 2024-02-19 DIAGNOSIS — I872 Venous insufficiency (chronic) (peripheral): Secondary | ICD-10-CM | POA: Diagnosis not present

## 2024-02-19 DIAGNOSIS — I10 Essential (primary) hypertension: Secondary | ICD-10-CM | POA: Diagnosis not present

## 2024-02-19 DIAGNOSIS — Z6830 Body mass index (BMI) 30.0-30.9, adult: Secondary | ICD-10-CM | POA: Diagnosis not present

## 2024-02-19 DIAGNOSIS — E6609 Other obesity due to excess calories: Secondary | ICD-10-CM | POA: Diagnosis not present

## 2024-02-19 DIAGNOSIS — M79605 Pain in left leg: Secondary | ICD-10-CM | POA: Diagnosis not present

## 2024-02-20 ENCOUNTER — Other Ambulatory Visit (HOSPITAL_COMMUNITY): Payer: Self-pay | Admitting: Internal Medicine

## 2024-02-20 DIAGNOSIS — M79605 Pain in left leg: Secondary | ICD-10-CM

## 2024-02-21 ENCOUNTER — Other Ambulatory Visit (HOSPITAL_COMMUNITY): Payer: Self-pay | Admitting: Internal Medicine

## 2024-02-21 DIAGNOSIS — Z136 Encounter for screening for cardiovascular disorders: Secondary | ICD-10-CM

## 2024-02-21 DIAGNOSIS — M79605 Pain in left leg: Secondary | ICD-10-CM

## 2024-02-29 ENCOUNTER — Ambulatory Visit (HOSPITAL_COMMUNITY)
Admission: RE | Admit: 2024-02-29 | Discharge: 2024-02-29 | Disposition: A | Discharge status: Home / Self Care | Source: Ambulatory Visit | Attending: Internal Medicine | Admitting: Internal Medicine

## 2024-02-29 DIAGNOSIS — M7122 Synovial cyst of popliteal space [Baker], left knee: Secondary | ICD-10-CM | POA: Diagnosis not present

## 2024-02-29 DIAGNOSIS — M79605 Pain in left leg: Secondary | ICD-10-CM

## 2024-02-29 DIAGNOSIS — Z136 Encounter for screening for cardiovascular disorders: Secondary | ICD-10-CM | POA: Insufficient documentation

## 2024-03-06 ENCOUNTER — Ambulatory Visit (HOSPITAL_COMMUNITY)
Admission: RE | Admit: 2024-03-06 | Discharge: 2024-03-06 | Disposition: A | Discharge status: Home / Self Care | Source: Ambulatory Visit | Attending: Internal Medicine | Admitting: Internal Medicine

## 2024-03-06 DIAGNOSIS — M79605 Pain in left leg: Secondary | ICD-10-CM | POA: Diagnosis not present

## 2024-03-06 DIAGNOSIS — I6523 Occlusion and stenosis of bilateral carotid arteries: Secondary | ICD-10-CM | POA: Diagnosis not present

## 2024-03-06 DIAGNOSIS — Z136 Encounter for screening for cardiovascular disorders: Secondary | ICD-10-CM | POA: Diagnosis not present

## 2024-03-06 DIAGNOSIS — Z0389 Encounter for observation for other suspected diseases and conditions ruled out: Secondary | ICD-10-CM | POA: Diagnosis not present

## 2024-05-20 DIAGNOSIS — M7062 Trochanteric bursitis, left hip: Secondary | ICD-10-CM | POA: Diagnosis not present

## 2024-05-20 DIAGNOSIS — Z96641 Presence of right artificial hip joint: Secondary | ICD-10-CM | POA: Diagnosis not present

## 2024-05-20 DIAGNOSIS — M170 Bilateral primary osteoarthritis of knee: Secondary | ICD-10-CM | POA: Diagnosis not present

## 2024-05-20 DIAGNOSIS — M7061 Trochanteric bursitis, right hip: Secondary | ICD-10-CM | POA: Diagnosis not present

## 2024-07-18 DIAGNOSIS — L600 Ingrowing nail: Secondary | ICD-10-CM | POA: Diagnosis not present

## 2024-07-18 DIAGNOSIS — M79674 Pain in right toe(s): Secondary | ICD-10-CM | POA: Diagnosis not present

## 2024-08-06 DIAGNOSIS — Z4889 Encounter for other specified surgical aftercare: Secondary | ICD-10-CM | POA: Diagnosis not present

## 2024-09-02 DIAGNOSIS — M170 Bilateral primary osteoarthritis of knee: Secondary | ICD-10-CM | POA: Diagnosis not present

## 2024-09-02 DIAGNOSIS — Z6831 Body mass index (BMI) 31.0-31.9, adult: Secondary | ICD-10-CM | POA: Diagnosis not present

## 2024-09-02 DIAGNOSIS — M7072 Other bursitis of hip, left hip: Secondary | ICD-10-CM | POA: Diagnosis not present

## 2024-09-02 DIAGNOSIS — Z23 Encounter for immunization: Secondary | ICD-10-CM | POA: Diagnosis not present

## 2024-09-02 DIAGNOSIS — I10 Essential (primary) hypertension: Secondary | ICD-10-CM | POA: Diagnosis not present

## 2024-09-02 DIAGNOSIS — E66811 Obesity, class 1: Secondary | ICD-10-CM | POA: Diagnosis not present

## 2024-09-02 DIAGNOSIS — E6609 Other obesity due to excess calories: Secondary | ICD-10-CM | POA: Diagnosis not present

## 2024-09-02 DIAGNOSIS — Z96641 Presence of right artificial hip joint: Secondary | ICD-10-CM | POA: Diagnosis not present

## 2024-09-02 DIAGNOSIS — I872 Venous insufficiency (chronic) (peripheral): Secondary | ICD-10-CM | POA: Diagnosis not present

## 2024-10-25 ENCOUNTER — Observation Stay (HOSPITAL_COMMUNITY)
Admission: EM | Admit: 2024-10-25 | Discharge: 2024-10-27 | Disposition: A | Attending: Family Medicine | Admitting: Family Medicine

## 2024-10-25 ENCOUNTER — Emergency Department (HOSPITAL_COMMUNITY)

## 2024-10-25 ENCOUNTER — Other Ambulatory Visit: Payer: Self-pay

## 2024-10-25 ENCOUNTER — Encounter (HOSPITAL_COMMUNITY): Payer: Self-pay | Admitting: *Deleted

## 2024-10-25 DIAGNOSIS — D120 Benign neoplasm of cecum: Secondary | ICD-10-CM | POA: Diagnosis not present

## 2024-10-25 DIAGNOSIS — K449 Diaphragmatic hernia without obstruction or gangrene: Secondary | ICD-10-CM | POA: Diagnosis not present

## 2024-10-25 DIAGNOSIS — K5730 Diverticulosis of large intestine without perforation or abscess without bleeding: Secondary | ICD-10-CM | POA: Diagnosis not present

## 2024-10-25 DIAGNOSIS — K222 Esophageal obstruction: Secondary | ICD-10-CM | POA: Diagnosis not present

## 2024-10-25 DIAGNOSIS — K921 Melena: Principal | ICD-10-CM | POA: Insufficient documentation

## 2024-10-25 DIAGNOSIS — I10 Essential (primary) hypertension: Secondary | ICD-10-CM | POA: Diagnosis not present

## 2024-10-25 DIAGNOSIS — R109 Unspecified abdominal pain: Secondary | ICD-10-CM | POA: Diagnosis present

## 2024-10-25 DIAGNOSIS — R413 Other amnesia: Secondary | ICD-10-CM | POA: Diagnosis present

## 2024-10-25 DIAGNOSIS — K625 Hemorrhage of anus and rectum: Principal | ICD-10-CM

## 2024-10-25 LAB — COMPREHENSIVE METABOLIC PANEL WITH GFR
ALT: 9 U/L (ref 0–44)
AST: 16 U/L (ref 15–41)
Albumin: 3.8 g/dL (ref 3.5–5.0)
Alkaline Phosphatase: 91 U/L (ref 38–126)
Anion gap: 14 (ref 5–15)
BUN: 22 mg/dL (ref 8–23)
CO2: 24 mmol/L (ref 22–32)
Calcium: 9.6 mg/dL (ref 8.9–10.3)
Chloride: 102 mmol/L (ref 98–111)
Creatinine, Ser: 0.82 mg/dL (ref 0.44–1.00)
GFR, Estimated: >60 mL/min
Glucose, Bld: 90 mg/dL (ref 70–99)
Potassium: 4 mmol/L (ref 3.5–5.1)
Sodium: 139 mmol/L (ref 135–145)
Total Bilirubin: 0.4 mg/dL (ref 0.0–1.2)
Total Protein: 6.6 g/dL (ref 6.5–8.1)

## 2024-10-25 LAB — CBC WITH DIFFERENTIAL/PLATELET
Abs Immature Granulocytes: 0.04 K/uL (ref 0.00–0.07)
Basophils Absolute: 0.1 K/uL (ref 0.0–0.1)
Basophils Relative: 1 %
Eosinophils Absolute: 0.2 K/uL (ref 0.0–0.5)
Eosinophils Relative: 2 %
HCT: 38.6 % (ref 36.0–46.0)
Hemoglobin: 12.4 g/dL (ref 12.0–15.0)
Immature Granulocytes: 1 %
Lymphocytes Relative: 18 %
Lymphs Abs: 1.3 K/uL (ref 0.7–4.0)
MCH: 31.1 pg (ref 26.0–34.0)
MCHC: 32.1 g/dL (ref 30.0–36.0)
MCV: 96.7 fL (ref 80.0–100.0)
Monocytes Absolute: 0.6 K/uL (ref 0.1–1.0)
Monocytes Relative: 8 %
Neutro Abs: 5.2 K/uL (ref 1.7–7.7)
Neutrophils Relative %: 70 %
Platelets: 225 K/uL (ref 150–400)
RBC: 3.99 MIL/uL (ref 3.87–5.11)
RDW: 11.9 % (ref 11.5–15.5)
WBC: 7.3 K/uL (ref 4.0–10.5)
nRBC: 0 % (ref 0.0–0.2)

## 2024-10-25 LAB — TYPE AND SCREEN
ABO/RH(D): AB POS
Antibody Screen: NEGATIVE

## 2024-10-25 LAB — LIPASE, BLOOD: Lipase: 20 U/L (ref 11–51)

## 2024-10-25 MED ORDER — AMLODIPINE BESYLATE 5 MG PO TABS
5.0000 mg | ORAL_TABLET | Freq: Once | ORAL | Status: AC
  Administered 2024-10-25: 5 mg via ORAL
  Filled 2024-10-25: qty 1

## 2024-10-25 MED ORDER — HYDRALAZINE HCL 20 MG/ML IJ SOLN
10.0000 mg | INTRAMUSCULAR | Status: DC | PRN
  Administered 2024-10-25: 10 mg via INTRAVENOUS
  Filled 2024-10-25: qty 1

## 2024-10-25 MED ORDER — HYDROCODONE-ACETAMINOPHEN 5-325 MG PO TABS: 1.0000 | ORAL_TABLET | Freq: Four times a day (QID) | ORAL | Status: DC | PRN

## 2024-10-25 MED ORDER — ONDANSETRON HCL 4 MG PO TABS: 4.0000 mg | ORAL_TABLET | Freq: Four times a day (QID) | ORAL | Status: DC | PRN

## 2024-10-25 MED ORDER — IOHEXOL 300 MG/ML  SOLN
100.0000 mL | Freq: Once | INTRAMUSCULAR | Status: AC | PRN
  Administered 2024-10-25: 100 mL via INTRAVENOUS

## 2024-10-25 MED ORDER — AMLODIPINE BESYLATE 5 MG PO TABS
10.0000 mg | ORAL_TABLET | Freq: Every day | ORAL | Status: DC
  Administered 2024-10-26 – 2024-10-27 (×2): 10 mg via ORAL
  Filled 2024-10-25 (×2): qty 2

## 2024-10-25 MED ORDER — AMLODIPINE BESYLATE 5 MG PO TABS
5.0000 mg | ORAL_TABLET | Freq: Every day | ORAL | Status: DC
  Administered 2024-10-25: 5 mg via ORAL
  Filled 2024-10-25: qty 1

## 2024-10-25 MED ORDER — PANTOPRAZOLE SODIUM 40 MG IV SOLR
40.0000 mg | Freq: Two times a day (BID) | INTRAVENOUS | Status: DC
  Administered 2024-10-26 – 2024-10-27 (×3): 40 mg via INTRAVENOUS
  Filled 2024-10-25 (×3): qty 10

## 2024-10-25 MED ORDER — ONDANSETRON HCL 4 MG/2ML IJ SOLN
4.0000 mg | Freq: Four times a day (QID) | INTRAMUSCULAR | Status: DC | PRN
  Administered 2024-10-26 – 2024-10-27 (×2): 4 mg via INTRAVENOUS
  Filled 2024-10-25: qty 2

## 2024-10-25 MED ORDER — ACETAMINOPHEN 650 MG RE SUPP: 650.0000 mg | Freq: Four times a day (QID) | RECTAL | Status: DC | PRN

## 2024-10-25 MED ORDER — AMLODIPINE BESYLATE 5 MG PO TABS: 5.0000 mg | ORAL_TABLET | Freq: Every day | ORAL | Status: DC

## 2024-10-25 MED ORDER — PANTOPRAZOLE SODIUM 40 MG IV SOLR
40.0000 mg | Freq: Once | INTRAVENOUS | Status: AC
  Administered 2024-10-25: 40 mg via INTRAVENOUS
  Filled 2024-10-25: qty 10

## 2024-10-25 MED ORDER — ACETAMINOPHEN 325 MG PO TABS: 650.0000 mg | ORAL_TABLET | Freq: Four times a day (QID) | ORAL | Status: DC | PRN

## 2024-10-25 MED ORDER — SODIUM CHLORIDE 0.9 % IV BOLUS
1000.0000 mL | Freq: Once | INTRAVENOUS | Status: AC
  Administered 2024-10-25: 1000 mL via INTRAVENOUS

## 2024-10-25 NOTE — ED Triage Notes (Signed)
 Pt with abd pain since yesterday and passing blood clots this morning. Emesis x 1 last night. Pt thought it was something she ate at first but then she was passing blood. + generalized weakness, denies any SOB

## 2024-10-25 NOTE — H&P (Signed)
 "                                                                                                          [facility] H&P   Patient Demographics:    Theresa Pierce, is a 79 y.o. female  MRN: [id number]   DOB - 1946/01/01  Admit Date - 10/25/2024  Outpatient Primary MD for the patient is Marvine Rush, MD   Chief Complaint  Patient presents with   Abdominal Pain      HPI:    Theresa Pierce  is a 79 y.o. female, with past medical history of GERD, TGA, hypertension, patient presents to ED secondary to complaints of passing blood clots this morning, patient reports she ate coleslaw yesterday, she felt nausea, vomiting after that with abdominal cramps, and early a.m. she started to pass blood clots, she denies diarrhea, fever, chills, reports she had had them x 4 so far, most recent at 2 PM prior to come to ED, she denies any history of GI bleed in the past, but as well she reports she never had colonoscopy or endoscopy or evaluated by GI for any reason. - In ED CT abdomen pelvis with no acute findings but significant for atherosclerosis disease, hemoglobin 12.4, most recent was 15.1 on 01/11/2023, ED discussed with GI who recommended admission on IV Protonix , clear liquid diet, and [facility] hospitalist consulted to admit    Review of systems:     A full 10 point Review of Systems was done, except as stated above, all other Review of Systems were negative.   With Past History of the following :    Past Medical History:  Diagnosis Date   Arthritis    hips and knees   Broken ankle    numbness in foot   GERD (gastroesophageal reflux disease)    Peripheral vascular disease       Past Surgical History:  Procedure Laterality Date   CATARACT EXTRACTION Bilateral 2018   I & D EXTREMITY Left 03/12/2019   Procedure: IRRIGATION AND DEBRIDEMENT EXTREMITY;  Surgeon: Camella Fallow, MD;  Location: [facility] OR;  Service: Orthopedics;  Laterality: Left;   laser ablations bilat GSV 2012 Bilateral 2012   leg    TONSILLECTOMY     3rd grade   TOTAL HIP ARTHROPLASTY Right 09/07/2022   Procedure: TOTAL HIP ARTHROPLASTY ANTERIOR APPROACH;  Surgeon: Melodi Lerner, MD;  Location: [facility];  Service: Orthopedics;  Laterality: Right;      Social History:     Social History   Tobacco Use   Smoking status: Never   Smokeless tobacco: Never  Substance Use Topics   Alcohol  use: No       Family History :     Family History  Problem Relation Age of Onset   Heart disease Other    Cancer Other    Arthritis Other       Home Medications:   Prior to Admission medications  Medication Sig Start Date End Date Taking? Authorizing Provider  acetaminophen  (TYLENOL ) 325 MG tablet Take 2 tablets (650  mg total) by mouth every 4 (four) hours as needed for mild pain (or temp > 37.5 C (99.5 F)). 01/12/23   Emokpae, Courage, MD  Ascorbic Acid  (VITAMIN C ) 1000 MG tablet Take 1,000 mg by mouth daily.    [provider]  Biotin 1000 MCG CHEW Chew 1,000 mg by mouth daily.    [provider]  cephALEXin (KEFLEX) 500 MG capsule Take 500 mg by mouth every 6 (six) hours. 01/23/23   [provider]  doxycycline  (ADOXA) 100 MG tablet Take 100 mg by mouth 2 (two) times daily.    [provider]  methylPREDNISolone  (MEDROL  DOSEPAK) 4 MG TBPK tablet Take 4 mg by mouth as directed. 01/23/23   [provider]  omeprazole (PRILOSEC OTC) 20 MG tablet Take 20 mg by mouth daily.    [provider]     Allergies:    Allergies[1]   Physical Exam:   Vitals  Blood pressure (!) 161/81, pulse 82, temperature 98.8 F (37.1 C), temperature source Oral, resp. rate 17, height 5' 6 (1.676 m), weight 82.1 kg, SpO2 94%.   1. General Well-developed female, laying in bed, no apparent distress  2. Normal affect and insight, Not Suicidal or Homicidal, Awake Alert, Oriented X 3.  3. No F.N deficits, ALL C.Nerves Intact, Strength 5/5 all 4 extremities, Sensation intact all 4  extremities, Plantars down going.  4. Ears and Eyes appear Normal, Conjunctivae clear, PERRLA. Moist Oral Mucosa.  5. Supple Neck, No JVD, No cervical lymphadenopathy appriciated, No Carotid Bruits.  6. Symmetrical Chest wall movement, Good air movement bilaterally, CTAB.  7. RRR, No Gallops, Rubs or Murmurs, No Parasternal Heave.  8. Positive Bowel Sounds, Abdomen Soft, No tenderness, No organomegaly appriciated,No rebound -guarding or rigidity.  9.  No Cyanosis, Normal Skin Turgor, No Skin Rash or Bruise.  Has varicose veins bilaterally  10. Good muscle tone,  joints appear normal , no effusions, Normal ROM.     Data Review:    CBC Recent Labs  Lab 10/25/24 1503  WBC 7.3  HGB 12.4  HCT 38.6  PLT 225  MCV 96.7  MCH 31.1  MCHC 32.1  RDW 11.9  LYMPHSABS 1.3  MONOABS 0.6  EOSABS 0.2  BASOSABS 0.1   ------------------------------------------------------------------------------------------------------------------  Chemistries  Recent Labs  Lab 10/25/24 1503  NA 139  K 4.0  CL 102  CO2 24  GLUCOSE 90  BUN 22  CREATININE 0.82  CALCIUM  9.6  AST 16  ALT 9  ALKPHOS 91  BILITOT 0.4   ------------------------------------------------------------------------------------------------------------------ estimated creatinine clearance is 59.1 mL/min (by C-G formula based on SCr of 0.82 mg/dL). ------------------------------------------------------------------------------------------------------------------ No results for input(s): TSH, T4TOTAL, T3FREE, THYROIDAB in the last 72 hours.  Invalid input(s): FREET3  Coagulation profile No results for input(s): INR, PROTIME in the last 168 hours. ------------------------------------------------------------------------------------------------------------------- No results for input(s): DDIMER in the last 72  hours. -------------------------------------------------------------------------------------------------------------------  Cardiac Enzymes No results for input(s): CKMB, TROPONINI, MYOGLOBIN in the last 168 hours.  Invalid input(s): CK ------------------------------------------------------------------------------------------------------------------ No results found for: BNP   ---------------------------------------------------------------------------------------------------------------  Urinalysis    Component Value Date/Time   COLORURINE STRAW (A) 09/16/2022 0812   APPEARANCEUR CLEAR 09/16/2022 0812   LABSPEC 1.005 09/16/2022 0812   PHURINE 8.0 09/16/2022 0812   GLUCOSEU NEGATIVE 09/16/2022 0812   HGBUR NEGATIVE 09/16/2022 0812   BILIRUBINUR NEGATIVE 09/16/2022 0812   KETONESUR 5 (A) 09/16/2022 0812   PROTEINUR NEGATIVE 09/16/2022 0812   NITRITE NEGATIVE 09/16/2022 0812   LEUKOCYTESUR NEGATIVE 09/16/2022  [id number]    ----------------------------------------------------------------------------------------------------------------   Imaging Results:    CT ABDOMEN PELVIS W CONTRAST Result Date: 10/25/2024 EXAM: CT ABDOMEN AND PELVIS WITH CONTRAST 10/25/2024 05:07:46 PM TECHNIQUE: CT of the abdomen and pelvis was performed with the administration of 100 mL of iohexol  (OMNIPAQUE ) 300 MG/ML solution. Multiplanar reformatted images are provided for review. Automated exposure control, iterative reconstruction, and/or weight-based adjustment of the mA/kV was utilized to reduce the radiation dose to as low as reasonably achievable. COMPARISON: None available. CLINICAL HISTORY: Abdominal pain, acute, nonlocalized. FINDINGS: LOWER CHEST: No acute abnormality. LIVER: The liver is unremarkable. GALLBLADDER AND BILE DUCTS: Tiny hyperdensity within the gallbladder lumen may represent a tiny gallstone. No gallbladder wall thickening or pericholecystic fluid. No biliary ductal dilatation.  SPLEEN: No acute abnormality. PANCREAS: No acute abnormality. ADRENAL GLANDS: No acute abnormality. KIDNEYS, URETERS AND BLADDER: No stones in the kidneys or ureters. No hydronephrosis. No perinephric or periureteral stranding. No filling defects of the partially visualized collecting systems on delayed imaging. Urinary bladder is unremarkable. GI AND BOWEL: Stomach demonstrates no acute abnormality. No small or large bowel thickening or dilatation. The appendix is unremarkable. Colonic diverticulosis. There is no bowel obstruction. PERITONEUM AND RETROPERITONEUM: No ascites. No free air. VASCULATURE: Aorta is normal in caliber. Severe atherosclerotic plaque. LYMPH NODES: No lymphadenopathy. REPRODUCTIVE ORGANS: Atrophic louterus. No adnexal mass. BONES AND SOFT TISSUES: Total right hip arthroplasty. Grade 1 anterolisthesis of L4 on L5. No acute osseous abnormality. No focal soft tissue abnormality. IMPRESSION: 1. No acute findings in the abdomen or pelvis. 2. Cholelithiasis without CT evidence of acute cholecystitis. 3. Severe atherosclerotic plaque. Electronically signed by: Morgane Naveau MD 10/25/2024 06:11 PM EST RP Workstation: [id number]      Assessment & Plan:    Principal Problem:   Hematochezia Active Problems:   Amnesia   Essential hypertension   Hematochezia - Patient presents with multiple bowel movements with significant blood clots started overnight - Hemoglobin within normal limit at 12.4, but this is less than her baseline which is between 15 and 16 last year - Patient with no GI workup in the past, no history of endoscopy or colonoscopy. - This is most likely provoked by infectious etiology as she reports nausea, vomiting and abdominal cramps after eating coleslaw yesterday - Continue with IV Protonix . - Monitor CBC closely. -CT abdomen pelvis with no acute findings - Will keep on clear liquid diet, n.p.o. after 5 AM ending further GI evaluation and possible need of  seizures.  Hypertension - Blood pressure uncontrolled, patient cannot recall her home medicine, will start on amlodipine  now and add as needed hydralazine    DVT Prophylaxis SCDs   AM Labs Ordered, also please review Full Orders  Family Communication: Admission, patients condition and plan of care including tests being ordered have been discussed with the patient  who indicate understanding and agree with the plan and Code Status.  Code Status full  Likely DC to  home  Consults called: GI by ED    Admission status: Observation  Time spent in minutes : 60 minutes   Brayton Lye M.D on 10/25/2024 at 7:25 PM   [facility] - Office  [phone]        [1]  Allergies Allergen Reactions   Amoxicillin Other (See Comments)    Did it involve swelling of the face/tongue/throat, SOB, or low BP? No Did it involve sudden or severe rash/hives, skin peeling, or any reaction on the inside of your mouth or nose? No Did you  need to seek medical attention at a hospital or doctor's office? Yes When did it last happen?   Her legs burned    If all above answers are NO, may proceed with cephalosporin use.    Codeine Itching   "

## 2024-10-25 NOTE — ED Notes (Signed)
 Patient transported to CT

## 2024-10-25 NOTE — Plan of Care (Signed)
   Problem: Education: Goal: Knowledge of General Education information will improve Description: Including pain rating scale, medication(s)/side effects and non-pharmacologic comfort measures Outcome: Progressing   Problem: Activity: Goal: Risk for activity intolerance will decrease Outcome: Progressing   Problem: Nutrition: Goal: Adequate nutrition will be maintained Outcome: Progressing

## 2024-10-26 DIAGNOSIS — R1310 Dysphagia, unspecified: Secondary | ICD-10-CM

## 2024-10-26 DIAGNOSIS — R710 Precipitous drop in hematocrit: Secondary | ICD-10-CM

## 2024-10-26 DIAGNOSIS — I10 Essential (primary) hypertension: Secondary | ICD-10-CM | POA: Diagnosis not present

## 2024-10-26 DIAGNOSIS — R112 Nausea with vomiting, unspecified: Secondary | ICD-10-CM

## 2024-10-26 DIAGNOSIS — K219 Gastro-esophageal reflux disease without esophagitis: Secondary | ICD-10-CM

## 2024-10-26 DIAGNOSIS — K921 Melena: Secondary | ICD-10-CM | POA: Diagnosis not present

## 2024-10-26 DIAGNOSIS — D120 Benign neoplasm of cecum: Secondary | ICD-10-CM | POA: Diagnosis not present

## 2024-10-26 LAB — BASIC METABOLIC PANEL WITH GFR
Anion gap: 8 (ref 5–15)
BUN: 15 mg/dL (ref 8–23)
CO2: 30 mmol/L (ref 22–32)
Calcium: 9.1 mg/dL (ref 8.9–10.3)
Chloride: 101 mmol/L (ref 98–111)
Creatinine, Ser: 0.62 mg/dL (ref 0.44–1.00)
GFR, Estimated: >60 mL/min
Glucose, Bld: 84 mg/dL (ref 70–99)
Potassium: 3.6 mmol/L (ref 3.5–5.1)
Sodium: 139 mmol/L (ref 135–145)

## 2024-10-26 LAB — CBC
HCT: 35.9 % — ABNORMAL LOW (ref 36.0–46.0)
Hemoglobin: 11.6 g/dL — ABNORMAL LOW (ref 12.0–15.0)
MCH: 31.1 pg (ref 26.0–34.0)
MCHC: 32.3 g/dL (ref 30.0–36.0)
MCV: 96.2 fL (ref 80.0–100.0)
Platelets: 205 K/uL (ref 150–400)
RBC: 3.73 MIL/uL — ABNORMAL LOW (ref 3.87–5.11)
RDW: 11.9 % (ref 11.5–15.5)
WBC: 5.5 K/uL (ref 4.0–10.5)
nRBC: 0 % (ref 0.0–0.2)

## 2024-10-26 MED ORDER — SODIUM CHLORIDE 0.9 % IV SOLN: INTRAVENOUS | Status: DC

## 2024-10-26 MED ORDER — PEG 3350-KCL-NA BICARB-NACL 420 G PO SOLR
4000.0000 mL | Freq: Once | ORAL | Status: AC
  Administered 2024-10-26: 4000 mL via ORAL

## 2024-10-26 NOTE — Consult Note (Signed)
 "     Referring Provider: No ref. provider found Primary Care Physician:  Marvine Rush, MD Primary Gastroenterologist:  Dr.Annamae Shivley  Reason for Consultation: Hematochezia  HPI: Patient is a pleasant 79 year old lady with a history of GERD who was in her usual state of fair health until 2 days ago when she developed severe bilateral lower quadrant abdominal cramping after eating some coleslaw.  Cramping was so severe she had secondary nausea and vomiting.  Within 12 hours she had gross blood per rectum black-red clotted stool multiple episodes.  Otherwise, has had no diarrhea or fever. She presented to the ED where she was found to be hemodynamically stable.  Hemoglobin down to 12.4 from baseline of 15.1. CT in the ED revealed no acute abnormality specifically no colitis.  Possible tiny gallbladder stone.  She has remained in amply stable.  Rectal bleeding has tapered off and so has abdominal cramping. This lady does have a history of peripheral vascular disease. Longstanding GERD -OTC omeprazole daily.  Endorses intermittent esophageal dysphagia.  Has never had a GI evaluation (no EGD or colonoscopy).  Rare Advil and/or Aleve.   Past Medical History:  Diagnosis Date   Arthritis    hips and knees   Broken ankle    numbness in foot   GERD (gastroesophageal reflux disease)    Peripheral vascular disease     Past Surgical History:  Procedure Laterality Date   CATARACT EXTRACTION Bilateral 2018   I & D EXTREMITY Left 03/12/2019   Procedure: IRRIGATION AND DEBRIDEMENT EXTREMITY;  Surgeon: Camella Fallow, MD;  Location: [facility] OR;  Service: Orthopedics;  Laterality: Left;   laser ablations bilat GSV 2012 Bilateral 2012   leg   TONSILLECTOMY     3rd grade   TOTAL HIP ARTHROPLASTY Right 09/07/2022   Procedure: TOTAL HIP ARTHROPLASTY ANTERIOR APPROACH;  Surgeon: Melodi Lerner, MD;  Location: [facility];  Service: Orthopedics;  Laterality: Right;    Prior to Admission medications  Medication Sig  Start Date End Date Taking? Authorizing Provider  acetaminophen  (TYLENOL ) 325 MG tablet Take 2 tablets (650 mg total) by mouth every 4 (four) hours as needed for mild pain (or temp > 37.5 C (99.5 F)). 01/12/23   Emokpae, Courage, MD  Ascorbic Acid  (VITAMIN C ) 1000 MG tablet Take 1,000 mg by mouth daily.    [provider]  Biotin 1000 MCG CHEW Chew 1,000 mg by mouth daily.    [provider]  cephALEXin (KEFLEX) 500 MG capsule Take 500 mg by mouth every 6 (six) hours. 01/23/23   [provider]  doxycycline  (ADOXA) 100 MG tablet Take 100 mg by mouth 2 (two) times daily.    [provider]  methylPREDNISolone  (MEDROL  DOSEPAK) 4 MG TBPK tablet Take 4 mg by mouth as directed. 01/23/23   [provider]  omeprazole (PRILOSEC OTC) 20 MG tablet Take 20 mg by mouth daily.    [provider]    Current Facility-Administered Medications  Medication Dose Route Frequency Provider Last Rate Last Admin   acetaminophen  (TYLENOL ) tablet 650 mg  650 mg Oral Q6H PRN Elgergawy, Dawood S, MD       Or   acetaminophen  (TYLENOL ) suppository 650 mg  650 mg Rectal Q6H PRN Elgergawy, Dawood S, MD       amLODipine  (NORVASC ) tablet 10 mg  10 mg Oral Daily Elgergawy, Dawood S, MD   10 mg at 10/26/24 0805   hydrALAZINE  (APRESOLINE ) injection 10 mg  10 mg Intravenous Q4H PRN Elgergawy,  Brayton RAMAN, MD   10 mg at 10/25/24 2108   HYDROcodone -acetaminophen  (NORCO/VICODIN) 5-325 MG per tablet 1 tablet  1 tablet Oral Q6H PRN Elgergawy, Brayton RAMAN, MD       ondansetron  (ZOFRAN ) tablet 4 mg  4 mg Oral Q6H PRN Elgergawy, Brayton RAMAN, MD       Or   ondansetron  (ZOFRAN ) injection 4 mg  4 mg Intravenous Q6H PRN Elgergawy, Brayton RAMAN, MD       pantoprazole  (PROTONIX ) injection 40 mg  40 mg Intravenous Q12H Elgergawy, Dawood S, MD   40 mg at 10/26/24 0516    Allergies as of 10/25/2024 - Review Complete 10/25/2024  Allergen Reaction Noted   Amoxicillin Other (See Comments) 04/23/2018   Codeine  Itching 10/28/2012    Family History  Problem Relation Age of Onset   Heart disease Other    Cancer Other    Arthritis Other     Social History   Socioeconomic History   Marital status: Married    Spouse name: Not on file   Number of children: Not on file   Years of education: Not on file   Highest education level: Not on file  Occupational History   Not on file  Tobacco Use   Smoking status: Never   Smokeless tobacco: Never  Vaping Use   Vaping status: Not on file  Substance and Sexual Activity   Alcohol  use: No   Drug use: No   Sexual activity: Not on file  Other Topics Concern   Not on file  Social History Narrative   Not on file   Social Drivers of Health   Tobacco Use: Low Risk (10/25/2024)   Patient History    Smoking Tobacco Use: Never    Smokeless Tobacco Use: Never    Passive Exposure: Not on file  Financial Resource Strain: Not on file  Food Insecurity: No Food Insecurity (10/25/2024)   Epic    Worried About Programme Researcher, Broadcasting/film/video in the Last Year: Never true    Ran Out of Food in the Last Year: Never true  Transportation Needs: No Transportation Needs (10/25/2024)   Epic    Lack of Transportation (Medical): No    Lack of Transportation (Non-Medical): No  Physical Activity: Not on file  Stress: Not on file  Social Connections: Socially Integrated (10/25/2024)   Social Connection and Isolation Panel    Frequency of Communication with Friends and Family: Three times a week    Frequency of Social Gatherings with Friends and Family: Three times a week    Attends Religious Services: More than 4 times per year    Active Member of Clubs or Organizations: No    Attends Banker Meetings: More than 4 times per year    Marital Status: Married  Catering Manager Violence: Not At Risk (10/25/2024)   Epic    Fear of Current or Ex-Partner: No    Emotionally Abused: No    Physically Abused: No    Sexually Abused: No  Depression (PHQ2-9): Not on file   Alcohol  Screen: Not on file  Housing: Low Risk (10/25/2024)   Epic    Unable to Pay for Housing in the Last Year: No    Number of Times Moved in the Last Year: 0    Homeless in the Last Year: No  Utilities: Not At Risk (10/25/2024)   Epic    Threatened with loss of utilities: No  Health Literacy: Not on file    Review  of Systems: As in history of present illness  Physical Exam: Vital signs in last 24 hours: Temp:  [97.9 F (36.6 C)-99.5 F (37.5 C)] 97.9 F (36.6 C) (01/03 0309) Pulse Rate:  [81-102] 88 (01/03 0309) Resp:  [16-18] 18 (01/03 0309) BP: (127-195)/(63-104) 127/68 (01/03 0309) SpO2:  [93 %-98 %] 96 % (01/03 0309) Weight:  [82.1 kg] 82.1 kg (01/02 1431) Last BM Date : 10/24/24 General:   Alert,  Well-developed, well-nourished, pleasant and cooperative in NAD Heart:  Regular rate and rhythm; no murmurs, clicks, rubs,  or gallops. Abdomen: Nondistended.  Positive bowel sound soft with minimal lower abdominal tenderness to palpation bilaterally.  No mass or rebound.  Intake/Output from previous day: No intake/output data recorded. Intake/Output this shift: No intake/output data recorded.  Lab Results: Recent Labs    10/25/24 1503 10/26/24 0409  WBC 7.3 5.5  HGB 12.4 11.6*  HCT 38.6 35.9*  PLT 225 205   BMET Recent Labs    10/25/24 1503 10/26/24 0409  NA 139 139  K 4.0 3.6  CL 102 101  CO2 24 30  GLUCOSE 90 84  BUN 22 15  CREATININE 0.82 0.62  CALCIUM  9.6 9.1   LFT Recent Labs    10/25/24 1503  PROT 6.6  ALBUMIN 3.8  AST 16  ALT 9  ALKPHOS 91  BILITOT 0.4   PT/INR No results for input(s): LABPROT, INR in the last 72 hours. Hepatitis Panel No results for input(s): HEPBSAG, HCVAB, HEPAIGM, HEPBIGM in the last 72 hours. C-Diff No results for input(s): CDIFFTOX in the last 72 hours.  Studies/Results: CT ABDOMEN PELVIS W CONTRAST Result Date: 10/25/2024 EXAM: CT ABDOMEN AND PELVIS WITH CONTRAST 10/25/2024 05:07:46 PM  TECHNIQUE: CT of the abdomen and pelvis was performed with the administration of 100 mL of iohexol  (OMNIPAQUE ) 300 MG/ML solution. Multiplanar reformatted images are provided for review. Automated exposure control, iterative reconstruction, and/or weight-based adjustment of the mA/kV was utilized to reduce the radiation dose to as low as reasonably achievable. COMPARISON: None available. CLINICAL HISTORY: Abdominal pain, acute, nonlocalized. FINDINGS: LOWER CHEST: No acute abnormality. LIVER: The liver is unremarkable. GALLBLADDER AND BILE DUCTS: Tiny hyperdensity within the gallbladder lumen may represent a tiny gallstone. No gallbladder wall thickening or pericholecystic fluid. No biliary ductal dilatation. SPLEEN: No acute abnormality. PANCREAS: No acute abnormality. ADRENAL GLANDS: No acute abnormality. KIDNEYS, URETERS AND BLADDER: No stones in the kidneys or ureters. No hydronephrosis. No perinephric or periureteral stranding. No filling defects of the partially visualized collecting systems on delayed imaging. Urinary bladder is unremarkable. GI AND BOWEL: Stomach demonstrates no acute abnormality. No small or large bowel thickening or dilatation. The appendix is unremarkable. Colonic diverticulosis. There is no bowel obstruction. PERITONEUM AND RETROPERITONEUM: No ascites. No free air. VASCULATURE: Aorta is normal in caliber. Severe atherosclerotic plaque. LYMPH NODES: No lymphadenopathy. REPRODUCTIVE ORGANS: Atrophic louterus. No adnexal mass. BONES AND SOFT TISSUES: Total right hip arthroplasty. Grade 1 anterolisthesis of L4 on L5. No acute osseous abnormality. No focal soft tissue abnormality. IMPRESSION: 1. No acute findings in the abdomen or pelvis. 2. Cholelithiasis without CT evidence of acute cholecystitis. 3. Severe atherosclerotic plaque. Electronically signed by: Morgane Naveau MD 10/25/2024 06:11 PM EST RP Workstation: [id number]   Impression: Very pleasant 78 year old lady admitted with acute  illness characterized by abdominal cramps transient nausea vomiting and gross blood per rectum.  Relative drop in hemoglobin.  Abdominal cramps and bleeding has already started to taper off.  CT scan reassuring.  I suspect  we are most likely dealing with ischemic or segmental colitis.  Other colonic etiologies less likely.  I do not believe this is infectious process.  Ingestion of coleslaw likely has nothing to do with acute illness.  Also, less likely bleeding is coming from her more proximal GI tract.  Chronic GERD, intermittent dysphagia.  Occasional  NSAID use.  No prior EGD or colonoscopy.  Recommendations:  Continue tracking H&H.  Clear liquid diet.  I have offered her both a diagnostic colonoscopy and EGD while she is here.  The risks, benefits, limitations, imponderables and alternatives regarding both EGD and colonoscopy have been reviewed with the patient. Questions have been answered. All parties agreeable.   Will plan for tomorrow morning at 10 AM.  Further recommendations to follow.       Notice:  This dictation was prepared with Dragon dictation along with smaller phrase technology. Any transcriptional errors that result from this process are unintentional and may not be corrected upon review.  "

## 2024-10-26 NOTE — Care Management Obs Status (Signed)
 MEDICARE OBSERVATION STATUS NOTIFICATION   Patient Details  Name: Theresa Pierce MRN: [id number] Date of Birth: Nov 22, 1945   Medicare Observation Status Notification Given:  Yes    Ronnald MARLA Sil, RN 10/26/2024, 10:20 AM

## 2024-10-26 NOTE — Progress Notes (Signed)
 " PROGRESS NOTE   Theresa Pierce  FMW:[id number] DOB: 08/01/1946 DOA: 10/25/2024 PCP: Marvine Rush, MD   Chief Complaint  Patient presents with   Abdominal Pain   Level of care: Telemetry  Brief Admission History:  79 y.o. female, with past medical history of GERD, TGA, hypertension, patient presents to ED secondary to complaints of passing blood clots this morning, patient reports she ate coleslaw yesterday, she felt nausea, vomiting after that with abdominal cramps, and early a.m. she started to pass blood clots, she denies diarrhea, fever, chills, reports she had had them x 4 so far, most recent at 2 PM prior to come to ED, she denies any history of GI bleed in the past, but as well she reports she never had colonoscopy or endoscopy or evaluated by GI for any reason. - In ED CT abdomen pelvis with no acute findings but significant for atherosclerosis disease, hemoglobin 12.4, most recent was 15.1 on 01/11/2023, ED discussed with GI who recommended admission on IV Protonix , clear liquid diet, and [facility] hospitalist consulted to admit   Assessment and Plan:  Hematochezia - Patient presented with multiple bowel movements with significant blood clots started overnight - Hemoglobin down to 11.6  - Patient with no GI workup in the past, no history of endoscopy or colonoscopy. - Possibly provoked by infectious etiology as she reports nausea, vomiting and abdominal cramps after eating coleslaw yesterday and having taken aleve x 2 doses  - Continue with IV Protonix . - Monitor CBC closely. -CT abdomen pelvis with no acute findings - Will keep n.p.o. pending GI evaluation   Hypertension - Blood pressure better controlled now, patient cannot recall her home medicine, continue amlodipine  now and add as needed hydralazine   DVT prophylaxis: SCDs Code Status:  Family Communication:  Disposition: Home    Consultants:  GI Procedures:   Antimicrobials:    Subjective: Pt says she has had no  further blood BM or black stool since arrival.  She has urinated several times and no blood seen.  Abdominal cramping seems to have resolved.   Objective: Vitals:   10/25/24 1940 10/25/24 1954 10/25/24 2347 10/26/24 0309  BP: (!) 168/98 (!) 188/92 138/63 127/68  Pulse:  93 89 88  Resp:  16 18 18   Temp:  98.2 F (36.8 C) 98.6 F (37 C) 97.9 F (36.6 C)  TempSrc:  Oral Oral Oral  SpO2:  96% 95% 96%  Weight:      Height:       No intake or output data in the 24 hours ending 10/26/24 1038 Filed Weights   10/25/24 1431  Weight: 82.1 kg   Examination:  General exam: Appears calm and comfortable  Respiratory system: Clear to auscultation. Respiratory effort normal. Cardiovascular system: normal S1 & S2 heard. No JVD, murmurs, rubs, gallops or clicks. No pedal edema. Gastrointestinal system: Abdomen is nondistended, soft and nontender. No organomegaly or masses felt. Normal bowel sounds heard. Central nervous system: Alert and oriented. No focal neurological deficits. Extremities: Symmetric 5 x 5 power. Skin: No rashes, lesions or ulcers. Psychiatry: Judgement and insight appear normal. Mood & affect appropriate.   Data Reviewed: I have personally reviewed following labs and imaging studies  CBC: Recent Labs  Lab 10/25/24 1503 10/26/24 0409  WBC 7.3 5.5  NEUTROABS 5.2  --   HGB 12.4 11.6*  HCT 38.6 35.9*  MCV 96.7 96.2  PLT 225 205    Basic Metabolic Panel: Recent Labs  Lab 10/25/24 1503 10/26/24 0409  NA 139 139  K 4.0 3.6  CL 102 101  CO2 24 30  GLUCOSE 90 84  BUN 22 15  CREATININE 0.82 0.62  CALCIUM  9.6 9.1    CBG: No results for input(s): GLUCAP in the last 168 hours.  No results found for this or any previous visit (from the past 240 hours).   Radiology Studies: CT ABDOMEN PELVIS W CONTRAST Result Date: 10/25/2024 EXAM: CT ABDOMEN AND PELVIS WITH CONTRAST 10/25/2024 05:07:46 PM TECHNIQUE: CT of the abdomen and pelvis was performed with the  administration of 100 mL of iohexol  (OMNIPAQUE ) 300 MG/ML solution. Multiplanar reformatted images are provided for review. Automated exposure control, iterative reconstruction, and/or weight-based adjustment of the mA/kV was utilized to reduce the radiation dose to as low as reasonably achievable. COMPARISON: None available. CLINICAL HISTORY: Abdominal pain, acute, nonlocalized. FINDINGS: LOWER CHEST: No acute abnormality. LIVER: The liver is unremarkable. GALLBLADDER AND BILE DUCTS: Tiny hyperdensity within the gallbladder lumen may represent a tiny gallstone. No gallbladder wall thickening or pericholecystic fluid. No biliary ductal dilatation. SPLEEN: No acute abnormality. PANCREAS: No acute abnormality. ADRENAL GLANDS: No acute abnormality. KIDNEYS, URETERS AND BLADDER: No stones in the kidneys or ureters. No hydronephrosis. No perinephric or periureteral stranding. No filling defects of the partially visualized collecting systems on delayed imaging. Urinary bladder is unremarkable. GI AND BOWEL: Stomach demonstrates no acute abnormality. No small or large bowel thickening or dilatation. The appendix is unremarkable. Colonic diverticulosis. There is no bowel obstruction. PERITONEUM AND RETROPERITONEUM: No ascites. No free air. VASCULATURE: Aorta is normal in caliber. Severe atherosclerotic plaque. LYMPH NODES: No lymphadenopathy. REPRODUCTIVE ORGANS: Atrophic louterus. No adnexal mass. BONES AND SOFT TISSUES: Total right hip arthroplasty. Grade 1 anterolisthesis of L4 on L5. No acute osseous abnormality. No focal soft tissue abnormality. IMPRESSION: 1. No acute findings in the abdomen or pelvis. 2. Cholelithiasis without CT evidence of acute cholecystitis. 3. Severe atherosclerotic plaque. Electronically signed by: Morgane Naveau MD 10/25/2024 06:11 PM EST RP Workstation: [id number]   Scheduled Meds:  amLODipine   10 mg Oral Daily   pantoprazole  (PROTONIX ) IV  40 mg Intravenous Q12H   Continuous  Infusions:   LOS: 0 days   Time spent: 50 mins  Kahiau Schewe Vicci, MD How to contact the [facility] Attending or Consulting provider 7A - 7P or covering provider during after hours 7P -7A, for this patient?  Check the care team in [facility] and look for a) attending/consulting [facility] provider listed and b) the [facility] team listed Log into [email or web address] to find provider on call.  Locate the [facility] provider you are looking for under [facility] and page to a number that you can be directly reached. If you still have difficulty reaching the provider, please page the [facility] (Director on Call) for the Hospitalists listed on amion for assistance.  10/26/2024, 10:38 AM    "

## 2024-10-26 NOTE — H&P (View-Only) (Signed)
 "     Referring Provider: No ref. provider found Primary Care Physician:  Marvine Rush, MD Primary Gastroenterologist:  Dr.Annamae Shivley  Reason for Consultation: Hematochezia  HPI: Patient is a pleasant 79 year old lady with a history of GERD who was in her usual state of fair health until 2 days ago when she developed severe bilateral lower quadrant abdominal cramping after eating some coleslaw.  Cramping was so severe she had secondary nausea and vomiting.  Within 12 hours she had gross blood per rectum black-red clotted stool multiple episodes.  Otherwise, has had no diarrhea or fever. She presented to the ED where she was found to be hemodynamically stable.  Hemoglobin down to 12.4 from baseline of 15.1. CT in the ED revealed no acute abnormality specifically no colitis.  Possible tiny gallbladder stone.  She has remained in amply stable.  Rectal bleeding has tapered off and so has abdominal cramping. This lady does have a history of peripheral vascular disease. Longstanding GERD -OTC omeprazole daily.  Endorses intermittent esophageal dysphagia.  Has never had a GI evaluation (no EGD or colonoscopy).  Rare Advil and/or Aleve.   Past Medical History:  Diagnosis Date   Arthritis    hips and knees   Broken ankle    numbness in foot   GERD (gastroesophageal reflux disease)    Peripheral vascular disease     Past Surgical History:  Procedure Laterality Date   CATARACT EXTRACTION Bilateral 2018   I & D EXTREMITY Left 03/12/2019   Procedure: IRRIGATION AND DEBRIDEMENT EXTREMITY;  Surgeon: Camella Fallow, MD;  Location: [facility] OR;  Service: Orthopedics;  Laterality: Left;   laser ablations bilat GSV 2012 Bilateral 2012   leg   TONSILLECTOMY     3rd grade   TOTAL HIP ARTHROPLASTY Right 09/07/2022   Procedure: TOTAL HIP ARTHROPLASTY ANTERIOR APPROACH;  Surgeon: Melodi Lerner, MD;  Location: [facility];  Service: Orthopedics;  Laterality: Right;    Prior to Admission medications  Medication Sig  Start Date End Date Taking? Authorizing Provider  acetaminophen  (TYLENOL ) 325 MG tablet Take 2 tablets (650 mg total) by mouth every 4 (four) hours as needed for mild pain (or temp > 37.5 C (99.5 F)). 01/12/23   Emokpae, Courage, MD  Ascorbic Acid  (VITAMIN C ) 1000 MG tablet Take 1,000 mg by mouth daily.    [provider]  Biotin 1000 MCG CHEW Chew 1,000 mg by mouth daily.    [provider]  cephALEXin (KEFLEX) 500 MG capsule Take 500 mg by mouth every 6 (six) hours. 01/23/23   [provider]  doxycycline  (ADOXA) 100 MG tablet Take 100 mg by mouth 2 (two) times daily.    [provider]  methylPREDNISolone  (MEDROL  DOSEPAK) 4 MG TBPK tablet Take 4 mg by mouth as directed. 01/23/23   [provider]  omeprazole (PRILOSEC OTC) 20 MG tablet Take 20 mg by mouth daily.    [provider]    Current Facility-Administered Medications  Medication Dose Route Frequency Provider Last Rate Last Admin   acetaminophen  (TYLENOL ) tablet 650 mg  650 mg Oral Q6H PRN Elgergawy, Dawood S, MD       Or   acetaminophen  (TYLENOL ) suppository 650 mg  650 mg Rectal Q6H PRN Elgergawy, Dawood S, MD       amLODipine  (NORVASC ) tablet 10 mg  10 mg Oral Daily Elgergawy, Dawood S, MD   10 mg at 10/26/24 0805   hydrALAZINE  (APRESOLINE ) injection 10 mg  10 mg Intravenous Q4H PRN Elgergawy,  Brayton RAMAN, MD   10 mg at 10/25/24 2108   HYDROcodone -acetaminophen  (NORCO/VICODIN) 5-325 MG per tablet 1 tablet  1 tablet Oral Q6H PRN Elgergawy, Brayton RAMAN, MD       ondansetron  (ZOFRAN ) tablet 4 mg  4 mg Oral Q6H PRN Elgergawy, Brayton RAMAN, MD       Or   ondansetron  (ZOFRAN ) injection 4 mg  4 mg Intravenous Q6H PRN Elgergawy, Brayton RAMAN, MD       pantoprazole  (PROTONIX ) injection 40 mg  40 mg Intravenous Q12H Elgergawy, Dawood S, MD   40 mg at 10/26/24 0516    Allergies as of 10/25/2024 - Review Complete 10/25/2024  Allergen Reaction Noted   Amoxicillin Other (See Comments) 04/23/2018   Codeine  Itching 10/28/2012    Family History  Problem Relation Age of Onset   Heart disease Other    Cancer Other    Arthritis Other     Social History   Socioeconomic History   Marital status: Married    Spouse name: Not on file   Number of children: Not on file   Years of education: Not on file   Highest education level: Not on file  Occupational History   Not on file  Tobacco Use   Smoking status: Never   Smokeless tobacco: Never  Vaping Use   Vaping status: Not on file  Substance and Sexual Activity   Alcohol  use: No   Drug use: No   Sexual activity: Not on file  Other Topics Concern   Not on file  Social History Narrative   Not on file   Social Drivers of Health   Tobacco Use: Low Risk (10/25/2024)   Patient History    Smoking Tobacco Use: Never    Smokeless Tobacco Use: Never    Passive Exposure: Not on file  Financial Resource Strain: Not on file  Food Insecurity: No Food Insecurity (10/25/2024)   Epic    Worried About Programme Researcher, Broadcasting/film/video in the Last Year: Never true    Ran Out of Food in the Last Year: Never true  Transportation Needs: No Transportation Needs (10/25/2024)   Epic    Lack of Transportation (Medical): No    Lack of Transportation (Non-Medical): No  Physical Activity: Not on file  Stress: Not on file  Social Connections: Socially Integrated (10/25/2024)   Social Connection and Isolation Panel    Frequency of Communication with Friends and Family: Three times a week    Frequency of Social Gatherings with Friends and Family: Three times a week    Attends Religious Services: More than 4 times per year    Active Member of Clubs or Organizations: No    Attends Banker Meetings: More than 4 times per year    Marital Status: Married  Catering Manager Violence: Not At Risk (10/25/2024)   Epic    Fear of Current or Ex-Partner: No    Emotionally Abused: No    Physically Abused: No    Sexually Abused: No  Depression (PHQ2-9): Not on file   Alcohol  Screen: Not on file  Housing: Low Risk (10/25/2024)   Epic    Unable to Pay for Housing in the Last Year: No    Number of Times Moved in the Last Year: 0    Homeless in the Last Year: No  Utilities: Not At Risk (10/25/2024)   Epic    Threatened with loss of utilities: No  Health Literacy: Not on file    Review  of Systems: As in history of present illness  Physical Exam: Vital signs in last 24 hours: Temp:  [97.9 F (36.6 C)-99.5 F (37.5 C)] 97.9 F (36.6 C) (01/03 0309) Pulse Rate:  [81-102] 88 (01/03 0309) Resp:  [16-18] 18 (01/03 0309) BP: (127-195)/(63-104) 127/68 (01/03 0309) SpO2:  [93 %-98 %] 96 % (01/03 0309) Weight:  [82.1 kg] 82.1 kg (01/02 1431) Last BM Date : 10/24/24 General:   Alert,  Well-developed, well-nourished, pleasant and cooperative in NAD Heart:  Regular rate and rhythm; no murmurs, clicks, rubs,  or gallops. Abdomen: Nondistended.  Positive bowel sound soft with minimal lower abdominal tenderness to palpation bilaterally.  No mass or rebound.  Intake/Output from previous day: No intake/output data recorded. Intake/Output this shift: No intake/output data recorded.  Lab Results: Recent Labs    10/25/24 1503 10/26/24 0409  WBC 7.3 5.5  HGB 12.4 11.6*  HCT 38.6 35.9*  PLT 225 205   BMET Recent Labs    10/25/24 1503 10/26/24 0409  NA 139 139  K 4.0 3.6  CL 102 101  CO2 24 30  GLUCOSE 90 84  BUN 22 15  CREATININE 0.82 0.62  CALCIUM  9.6 9.1   LFT Recent Labs    10/25/24 1503  PROT 6.6  ALBUMIN 3.8  AST 16  ALT 9  ALKPHOS 91  BILITOT 0.4   PT/INR No results for input(s): LABPROT, INR in the last 72 hours. Hepatitis Panel No results for input(s): HEPBSAG, HCVAB, HEPAIGM, HEPBIGM in the last 72 hours. C-Diff No results for input(s): CDIFFTOX in the last 72 hours.  Studies/Results: CT ABDOMEN PELVIS W CONTRAST Result Date: 10/25/2024 EXAM: CT ABDOMEN AND PELVIS WITH CONTRAST 10/25/2024 05:07:46 PM  TECHNIQUE: CT of the abdomen and pelvis was performed with the administration of 100 mL of iohexol  (OMNIPAQUE ) 300 MG/ML solution. Multiplanar reformatted images are provided for review. Automated exposure control, iterative reconstruction, and/or weight-based adjustment of the mA/kV was utilized to reduce the radiation dose to as low as reasonably achievable. COMPARISON: None available. CLINICAL HISTORY: Abdominal pain, acute, nonlocalized. FINDINGS: LOWER CHEST: No acute abnormality. LIVER: The liver is unremarkable. GALLBLADDER AND BILE DUCTS: Tiny hyperdensity within the gallbladder lumen may represent a tiny gallstone. No gallbladder wall thickening or pericholecystic fluid. No biliary ductal dilatation. SPLEEN: No acute abnormality. PANCREAS: No acute abnormality. ADRENAL GLANDS: No acute abnormality. KIDNEYS, URETERS AND BLADDER: No stones in the kidneys or ureters. No hydronephrosis. No perinephric or periureteral stranding. No filling defects of the partially visualized collecting systems on delayed imaging. Urinary bladder is unremarkable. GI AND BOWEL: Stomach demonstrates no acute abnormality. No small or large bowel thickening or dilatation. The appendix is unremarkable. Colonic diverticulosis. There is no bowel obstruction. PERITONEUM AND RETROPERITONEUM: No ascites. No free air. VASCULATURE: Aorta is normal in caliber. Severe atherosclerotic plaque. LYMPH NODES: No lymphadenopathy. REPRODUCTIVE ORGANS: Atrophic louterus. No adnexal mass. BONES AND SOFT TISSUES: Total right hip arthroplasty. Grade 1 anterolisthesis of L4 on L5. No acute osseous abnormality. No focal soft tissue abnormality. IMPRESSION: 1. No acute findings in the abdomen or pelvis. 2. Cholelithiasis without CT evidence of acute cholecystitis. 3. Severe atherosclerotic plaque. Electronically signed by: Morgane Naveau MD 10/25/2024 06:11 PM EST RP Workstation: [id number]   Impression: Very pleasant 78 year old lady admitted with acute  illness characterized by abdominal cramps transient nausea vomiting and gross blood per rectum.  Relative drop in hemoglobin.  Abdominal cramps and bleeding has already started to taper off.  CT scan reassuring.  I suspect  we are most likely dealing with ischemic or segmental colitis.  Other colonic etiologies less likely.  I do not believe this is infectious process.  Ingestion of coleslaw likely has nothing to do with acute illness.  Also, less likely bleeding is coming from her more proximal GI tract.  Chronic GERD, intermittent dysphagia.  Occasional  NSAID use.  No prior EGD or colonoscopy.  Recommendations:  Continue tracking H&H.  Clear liquid diet.  I have offered her both a diagnostic colonoscopy and EGD while she is here.  The risks, benefits, limitations, imponderables and alternatives regarding both EGD and colonoscopy have been reviewed with the patient. Questions have been answered. All parties agreeable.   Will plan for tomorrow morning at 10 AM.  Further recommendations to follow.       Notice:  This dictation was prepared with Dragon dictation along with smaller phrase technology. Any transcriptional errors that result from this process are unintentional and may not be corrected upon review.  "

## 2024-10-26 NOTE — Hospital Course (Signed)
 79 y.o. female, with past medical history of GERD, TGA, hypertension, patient presents to ED secondary to complaints of passing blood clots this morning, patient reports she ate coleslaw yesterday, she felt nausea, vomiting after that with abdominal cramps, and early a.m. she started to pass blood clots, she denies diarrhea, fever, chills, reports she had had them x 4 so far, most recent at 2 PM prior to come to ED, she denies any history of GI bleed in the past, but as well she reports she never had colonoscopy or endoscopy or evaluated by GI for any reason. - In ED CT abdomen pelvis with no acute findings but significant for atherosclerosis disease, hemoglobin 12.4, most recent was 15.1 on 01/11/2023, ED discussed with GI who recommended admission on IV Protonix , clear liquid diet, and [facility] hospitalist consulted to admit

## 2024-10-27 ENCOUNTER — Encounter (HOSPITAL_COMMUNITY): Payer: Self-pay | Admitting: Internal Medicine

## 2024-10-27 ENCOUNTER — Observation Stay (HOSPITAL_COMMUNITY)

## 2024-10-27 ENCOUNTER — Encounter: Payer: Self-pay | Attending: Emergency Medicine

## 2024-10-27 DIAGNOSIS — K5731 Diverticulosis of large intestine without perforation or abscess with bleeding: Secondary | ICD-10-CM

## 2024-10-27 DIAGNOSIS — K449 Diaphragmatic hernia without obstruction or gangrene: Secondary | ICD-10-CM | POA: Diagnosis not present

## 2024-10-27 DIAGNOSIS — D120 Benign neoplasm of cecum: Principal | ICD-10-CM

## 2024-10-27 DIAGNOSIS — K5730 Diverticulosis of large intestine without perforation or abscess without bleeding: Secondary | ICD-10-CM

## 2024-10-27 DIAGNOSIS — K222 Esophageal obstruction: Secondary | ICD-10-CM | POA: Diagnosis not present

## 2024-10-27 DIAGNOSIS — I10 Essential (primary) hypertension: Secondary | ICD-10-CM | POA: Diagnosis not present

## 2024-10-27 DIAGNOSIS — K921 Melena: Secondary | ICD-10-CM | POA: Diagnosis not present

## 2024-10-27 HISTORY — PX: ESOPHAGEAL DILATION: SHX303

## 2024-10-27 HISTORY — PX: COLONOSCOPY: SHX5424

## 2024-10-27 HISTORY — PX: ESOPHAGOGASTRODUODENOSCOPY: SHX5428

## 2024-10-27 HISTORY — PX: POLYPECTOMY: SHX149

## 2024-10-27 LAB — CBC
HCT: 34.1 % — ABNORMAL LOW (ref 36.0–46.0)
Hemoglobin: 11.3 g/dL — ABNORMAL LOW (ref 12.0–15.0)
MCH: 31.7 pg (ref 26.0–34.0)
MCHC: 33.1 g/dL (ref 30.0–36.0)
MCV: 95.5 fL (ref 80.0–100.0)
Platelets: 220 K/uL (ref 150–400)
RBC: 3.57 MIL/uL — ABNORMAL LOW (ref 3.87–5.11)
RDW: 11.8 % (ref 11.5–15.5)
WBC: 5.8 K/uL (ref 4.0–10.5)
nRBC: 0 % (ref 0.0–0.2)

## 2024-10-27 MED ORDER — MIDAZOLAM HCL 2 MG/2ML IJ SOLN
INTRAMUSCULAR | Status: AC
  Filled 2024-10-27: qty 2

## 2024-10-27 MED ORDER — PROPOFOL 10 MG/ML IV BOLUS
INTRAVENOUS | Status: DC | PRN
  Administered 2024-10-27: 30 mg via INTRAVENOUS

## 2024-10-27 MED ORDER — LACTATED RINGERS IV SOLN: INTRAVENOUS | Status: DC | PRN

## 2024-10-27 MED ORDER — MIDAZOLAM HCL (PF) 2 MG/2ML IJ SOLN
INTRAMUSCULAR | Status: DC | PRN
  Administered 2024-10-27: 2 mg via INTRAVENOUS

## 2024-10-27 MED ORDER — FENTANYL CITRATE (PF) 100 MCG/2ML IJ SOLN
INTRAMUSCULAR | Status: AC
  Filled 2024-10-27: qty 2

## 2024-10-27 MED ORDER — FENTANYL CITRATE (PF) 100 MCG/2ML IJ SOLN
INTRAMUSCULAR | Status: DC | PRN
  Administered 2024-10-27 (×2): 50 ug via INTRAVENOUS

## 2024-10-27 NOTE — Progress Notes (Addendum)
 Patient placed in OBSERVATION on 1/2 for Hematochezia. Inpatient Care Manager (ICM) conducted chart review and contacted patient by phone to complete brief admission assessment.    Patient s/p Colonoscopy this morning and with orders to DISCHARGE, this Note will also serves as my DISCHARGE ASSESSMENT as well.  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  Son to drive her home.  Patient denied any discharge needs.  No discharge needs identified at this time. However, IPCM team will continue to follow along      10/27/24 1235  TOC Brief Assessment  Insurance and Status Reviewed  Patient has primary care physician Yes  Home environment has been reviewed Home with Spouse  Prior level of function: Independent  Prior/Current Home Services No current home services  Social Drivers of Health Review SDOH reviewed no interventions necessary  Readmission risk has been reviewed Yes  Transition of care needs no transition of care needs at this time

## 2024-10-27 NOTE — Discharge Instructions (Signed)
 IMPORTANT INFORMATION: PAY CLOSE ATTENTION  ? ?PHYSICIAN DISCHARGE INSTRUCTIONS ? ?Follow with Primary care provider  Assunta Found, MD  and other consultants as instructed by your Hospitalist Physician ? ?SEEK MEDICAL CARE OR RETURN TO EMERGENCY ROOM IF SYMPTOMS COME BACK, WORSEN OR NEW PROBLEM DEVELOPS  ? ?Please note: ?You were cared for by a hospitalist during your hospital stay. Every effort will be made to forward records to your primary care provider.  You can request that your primary care provider send for your hospital records if they have not received them.  Once you are discharged, your primary care physician will handle any further medical issues. Please note that NO REFILLS for any discharge medications will be authorized once you are discharged, as it is imperative that you return to your primary care physician (or establish a relationship with a primary care physician if you do not have one) for your post hospital discharge needs so that they can reassess your need for medications and monitor your lab values. ? ?Please get a complete blood count and chemistry panel checked by your Primary MD at your next visit, and again as instructed by your Primary MD. ? ?Get Medicines reviewed and adjusted: ?Please take all your medications with you for your next visit with your Primary MD ? ?Laboratory/radiological data: ?Please request your Primary MD to go over all hospital tests and procedure/radiological results at the follow up, please ask your primary care provider to get all Hospital records sent to his/her office. ? ?In some cases, they will be blood work, cultures and biopsy results pending at the time of your discharge. Please request that your primary care provider follow up on these results. ? ?If you are diabetic, please bring your blood sugar readings with you to your follow up appointment with primary care.   ? ?Please call and make your follow up appointments as soon as possible.   ? ?Also Note  the following: ?If you experience worsening of your admission symptoms, develop shortness of breath, life threatening emergency, suicidal or homicidal thoughts you must seek medical attention immediately by calling 911 or calling your MD immediately  if symptoms less severe. ? ?You must read complete instructions/literature along with all the possible adverse reactions/side effects for all the Medicines you take and that have been prescribed to you. Take any new Medicines after you have completely understood and accpet all the possible adverse reactions/side effects.  ? ?Do not drive when taking Pain medications or sleeping medications (Benzodiazepines) ? ?Do not take more than prescribed Pain, Sleep and Anxiety Medications. It is not advisable to combine anxiety,sleep and pain medications without talking with your primary care practitioner ? ?Special Instructions: If you have smoked or chewed Tobacco  in the last 2 yrs please stop smoking, stop any regular Alcohol  and or any Recreational drug use. ? ?Wear Seat belts while driving.  Do not drive if taking any narcotic, mind altering or controlled substances or recreational drugs or alcohol.  ? ? ? ? ? ?

## 2024-10-27 NOTE — Op Note (Signed)
 [facility] Patient Name: Theresa Pierce Procedure Date: 10/27/2024 9:39 AM MRN: [id number] Date of Birth: [birth year] Attending MD: Lamar Ozell Hollingshead , MD, [id number] CSN: [id number] Age: 79 Admit Type: Inpatient Procedure:                Colonoscopy Indications:              Hematochezia Providers:                Lamar Ozell Hollingshead, MD, Olam Ada, RN, Gordy Lonni Balm, Technician Referring MD:              Medicines:                Propofol  per Anesthesia Complications:            No immediate complications. Estimated Blood Loss:     Estimated blood loss was minimal. Procedure:                Pre-Anesthesia Assessment:                           - Prior to the procedure, a History and Physical                            was performed, and patient medications and                            allergies were reviewed. The patient's tolerance of                            previous anesthesia was also reviewed. The risks                            and benefits of the procedure and the sedation                            options and risks were discussed with the patient.                            All questions were answered, and informed consent                            was obtained. Prior Anticoagulants: The patient has                            taken no anticoagulant or antiplatelet agents. ASA                            Grade Assessment: III - A patient with severe                            systemic disease. After reviewing the risks and  benefits, the patient was deemed in satisfactory                            condition to undergo the procedure.                           After obtaining informed consent, the colonoscope                            was passed under direct vision. Throughout the                            procedure, the patient's blood pressure, pulse, and                            oxygen saturations  were monitored continuously. The                            [id number] ([id number]) Colon was introduced through                            the anus and advanced to the 5 cm into the ileum.                            The colonoscopy was performed without difficulty.                            The patient tolerated the procedure well. The                            quality of the bowel preparation was adequate. The                            terminal ileum, ileocecal valve, appendiceal                            orifice, and rectum were photographed. Scope In: 10:17:47 AM Scope Out: 10:34:42 AM Scope Withdrawal Time: 0 hours 10 minutes 54 seconds  Total Procedure Duration: 0 hours 16 minutes 55 seconds  Findings:      The perianal and digital rectal examinations were normal.      Scattered diverticula were found in the entire colon. Distal 5 cm of TI       appeared normal      The exam was otherwise without abnormality on direct and retroflexion       views.      A 5 mm polyp was found in the ileocecal valve. The polyp was sessile.       The polyp was removed with a cold snare. Resection and retrieval were       complete. Estimated blood loss was minimal. No blood seen in the lower       GI tract Impression:               - Diverticulosis in the entire examined colon.                           -  The examination was otherwise normal on direct                            and retroflexion views.                           - One 5 mm polyp at the ileocecal valve, removed                            with a cold snare. Resected and retrieved. Moderate Sedation:      Moderate (conscious) sedation was personally administered by an       anesthesia professional. The following parameters were monitored: oxygen       saturation, heart rate, blood pressure, respiratory rate, EKG, adequacy       of pulmonary ventilation, and response to care. Recommendation:           - Repeat colonoscopy date to be  determined after                            pending pathology results are reviewed for                            surveillance.                           - Return patient to hospital ward for possible                            discharge same day.                           - Advance diet as tolerated.                           - Continue present medications. See EGD report.                            Etiology of hematochezia not entirely clear -                            diverticular etiology most likely. Did not see                            anything consistent with ischemia or colitis.                            Appears to be a self-limiting phenomenon.                           - From a GI standpoint, she can probably be                            discharged later today. See EGD report. If no  further bleeding then no further GI evaluation                            warranted. I discussed my findings and                            recommendations face-to-face with patient's son                            Mayo Owczarzak in the waiting room. Procedure Code(s):        --- Professional ---                           [id number], Colonoscopy, flexible; with removal of                            tumor(s), polyp(s), or other lesion(s) by snare                            technique Diagnosis Code(s):        --- Professional ---                           D12.0, Benign neoplasm of cecum                           K92.1, Melena (includes Hematochezia)                           K57.30, Diverticulosis of large intestine without                            perforation or abscess without bleeding CPT copyright 2022 American Medical Association. All rights reserved. The codes documented in this report are preliminary and upon coder review may  be revised to meet current compliance requirements. Lamar HERO. Ethon Wymer, MD Lamar Ozell Hollingshead, MD 10/27/2024 10:52:39 AM This report has been  signed electronically. Number of Addenda: 0

## 2024-10-27 NOTE — ED Provider Notes (Signed)
 " [facility] MEDICAL SURGICAL UNIT Provider Note   CSN: [id number] Arrival date & time: 10/25/24  1346     Patient presents with: Abdominal Pain   Theresa Pierce is a 79 y.o. female.   Patient states she has been having dark red blood from her rectum.  This has happened a couple times.  Minimal abdominal discomfort  The history is provided by the patient and medical records. No language interpreter was used.  Abdominal Pain Pain location:  Generalized Pain quality: aching   Pain radiates to:  Does not radiate Pain severity:  Mild Onset quality:  Gradual Timing:  Intermittent Progression:  Waxing and waning Chronicity:  New Context: not alcohol  use   Relieved by:  Nothing Associated symptoms: no chest pain, no cough, no diarrhea, no fatigue and no hematuria        Prior to Admission medications  Medication Sig Start Date End Date Taking? Authorizing Provider  acetaminophen  (TYLENOL ) 325 MG tablet Take 2 tablets (650 mg total) by mouth every 4 (four) hours as needed for mild pain (or temp > 37.5 C (99.5 F)). Patient taking differently: Take 650 mg by mouth 2 (two) times daily as needed for mild pain (pain score 1-3) (or temp > 37.5 C (99.5 F)). 01/12/23  Yes Emokpae, Courage, MD  amLODipine  (NORVASC ) 10 MG tablet Take 10 mg by mouth daily. 10/22/24  Yes [provider]  aspirin  EC 81 MG tablet Take 81 mg by mouth See admin instructions. Takes one tablet every 4 to 5 days   Yes [provider]  cholecalciferol (VITAMIN D3) 25 MCG (1000 UNIT) tablet Take 1,000 Units by mouth daily.   Yes [provider]  meloxicam (MOBIC) 7.5 MG tablet Take 7.5 mg by mouth daily.   Yes [provider]  omeprazole (PRILOSEC OTC) 20 MG tablet Take 20 mg by mouth daily.   Yes [provider]    Allergies: Amoxicillin and Codeine    Review of Systems  Constitutional:  Negative for appetite change and fatigue.  HENT:  Negative for congestion, ear  discharge and sinus pressure.   Eyes:  Negative for discharge.  Respiratory:  Negative for cough.   Cardiovascular:  Negative for chest pain.  Gastrointestinal:  Positive for abdominal pain. Negative for diarrhea.       Rectal bleeding  Genitourinary:  Negative for frequency and hematuria.  Musculoskeletal:  Negative for back pain.  Skin:  Negative for rash.  Neurological:  Negative for seizures and headaches.  Psychiatric/Behavioral:  Negative for hallucinations.     Updated Vital Signs BP 136/76 (BP Location: Left Wrist)   Pulse 79   Temp 97.9 F (36.6 C) (Oral)   Resp 16   Ht 5' 6 (1.676 m)   Wt 82.1 kg   SpO2 97%   BMI 29.21 kg/m   Physical Exam Vitals and nursing note reviewed.  Constitutional:      Appearance: She is well-developed.  HENT:     Head: Normocephalic.     Nose: Nose normal.  Eyes:     General: No scleral icterus.    Conjunctiva/sclera: Conjunctivae normal.  Neck:     Thyroid : No thyromegaly.  Cardiovascular:     Rate and Rhythm: Normal rate and regular rhythm.     Heart sounds: No murmur heard.    No friction rub. No gallop.  Pulmonary:     Breath sounds: No stridor. No wheezing or rales.  Chest:     Chest wall:  No tenderness.  Abdominal:     General: There is no distension.     Tenderness: There is no abdominal tenderness. There is no rebound.  Genitourinary:    Comments: Rectal exam heme positive brown stool Musculoskeletal:        General: Normal range of motion.     Cervical back: Neck supple.  Lymphadenopathy:     Cervical: No cervical adenopathy.  Skin:    Findings: No erythema or rash.  Neurological:     Mental Status: She is alert and oriented to person, place, and time.     Motor: No abnormal muscle tone.     Coordination: Coordination normal.  Psychiatric:        Behavior: Behavior normal.     (all labs ordered are listed, but only abnormal results are displayed) Labs Reviewed  CBC - Abnormal; Notable for the following  components:      Result Value   RBC 3.73 (*)    Hemoglobin 11.6 (*)    HCT 35.9 (*)    All other components within normal limits  CBC - Abnormal; Notable for the following components:   RBC 3.57 (*)    Hemoglobin 11.3 (*)    HCT 34.1 (*)    All other components within normal limits  COMPREHENSIVE METABOLIC PANEL WITH GFR  CBC WITH DIFFERENTIAL/PLATELET  LIPASE, BLOOD  BASIC METABOLIC PANEL WITH GFR  POC OCCULT BLOOD, ED  TYPE AND SCREEN  SURGICAL PATHOLOGY    EKG: None  Radiology: CT ABDOMEN PELVIS W CONTRAST Result Date: 10/25/2024 EXAM: CT ABDOMEN AND PELVIS WITH CONTRAST 10/25/2024 05:07:46 PM TECHNIQUE: CT of the abdomen and pelvis was performed with the administration of 100 mL of iohexol  (OMNIPAQUE ) 300 MG/ML solution. Multiplanar reformatted images are provided for review. Automated exposure control, iterative reconstruction, and/or weight-based adjustment of the mA/kV was utilized to reduce the radiation dose to as low as reasonably achievable. COMPARISON: None available. CLINICAL HISTORY: Abdominal pain, acute, nonlocalized. FINDINGS: LOWER CHEST: No acute abnormality. LIVER: The liver is unremarkable. GALLBLADDER AND BILE DUCTS: Tiny hyperdensity within the gallbladder lumen may represent a tiny gallstone. No gallbladder wall thickening or pericholecystic fluid. No biliary ductal dilatation. SPLEEN: No acute abnormality. PANCREAS: No acute abnormality. ADRENAL GLANDS: No acute abnormality. KIDNEYS, URETERS AND BLADDER: No stones in the kidneys or ureters. No hydronephrosis. No perinephric or periureteral stranding. No filling defects of the partially visualized collecting systems on delayed imaging. Urinary bladder is unremarkable. GI AND BOWEL: Stomach demonstrates no acute abnormality. No small or large bowel thickening or dilatation. The appendix is unremarkable. Colonic diverticulosis. There is no bowel obstruction. PERITONEUM AND RETROPERITONEUM: No ascites. No free air.  VASCULATURE: Aorta is normal in caliber. Severe atherosclerotic plaque. LYMPH NODES: No lymphadenopathy. REPRODUCTIVE ORGANS: Atrophic louterus. No adnexal mass. BONES AND SOFT TISSUES: Total right hip arthroplasty. Grade 1 anterolisthesis of L4 on L5. No acute osseous abnormality. No focal soft tissue abnormality. IMPRESSION: 1. No acute findings in the abdomen or pelvis. 2. Cholelithiasis without CT evidence of acute cholecystitis. 3. Severe atherosclerotic plaque. Electronically signed by: Morgane Naveau MD 10/25/2024 06:11 PM EST RP Workstation: [id number]     Procedures   Medications Ordered in the ED  acetaminophen  (TYLENOL ) tablet 650 mg ( Oral MAR Unhold 10/27/24 1112)    Or  acetaminophen  (TYLENOL ) suppository 650 mg ( Rectal MAR Unhold 10/27/24 1112)  HYDROcodone -acetaminophen  (NORCO/VICODIN) 5-325 MG per tablet 1 tablet ( Oral MAR Unhold 10/27/24 1112)  ondansetron  (ZOFRAN ) tablet 4 mg ( Oral  MAR Unhold 10/27/24 1112)    Or  ondansetron  (ZOFRAN ) injection 4 mg ( Intravenous MAR Unhold 10/27/24 1112)  hydrALAZINE  (APRESOLINE ) injection 10 mg ( Intravenous MAR Unhold 10/27/24 1112)  pantoprazole  (PROTONIX ) injection 40 mg ( Intravenous MAR Unhold 10/27/24 1112)  amLODipine  (NORVASC ) tablet 10 mg ( Oral MAR Unhold 10/27/24 1112)  pantoprazole  (PROTONIX ) injection 40 mg (40 mg Intravenous Given 10/25/24 1638)  sodium chloride  0.9 % bolus 1,000 mL (1,000 mLs Intravenous Bolus 10/25/24 1637)  iohexol  (OMNIPAQUE ) 300 MG/ML solution 100 mL (100 mLs Intravenous Contrast Given 10/25/24 1659)  amLODipine  (NORVASC ) tablet 5 mg (5 mg Oral Given 10/25/24 2157)  polyethylene glycol-electrolytes (NuLYTELY ) solution 4,000 mL (4,000 mLs Oral Given 10/26/24 1442)                                    Medical Decision Making Amount and/or Complexity of Data Reviewed Labs: ordered. Radiology: ordered.  Risk Prescription drug management. Decision regarding hospitalization.   Patient with rectal bleeding.  Patient is  stable will be admitted to medicine with GI consult tomorrow     Final diagnoses:  None    ED Discharge Orders     None          Suzette Pac, MD 10/27/24 1655  "

## 2024-10-27 NOTE — Interval H&P Note (Signed)
 History and Physical Interval Note:  10/27/2024 9:53 AM  Theresa Pierce  has presented today for surgery, with the diagnosis of Melena, hematochezia, GERD, dysphagia.  The various methods of treatment have been discussed with the patient and family. After consideration of risks, benefits and other options for treatment, the patient has consented to  Procedures with comments: COLONOSCOPY (N/A) - EGD and colonoscopy EGD (ESOPHAGOGASTRODUODENOSCOPY) (N/A) DILATION, ESOPHAGUS (N/A) as a surgical intervention.  The patient's history has been reviewed, patient examined, no change in status, stable for surgery.  I have reviewed the patient's chart and labs.  Questions were answered to the patient's satisfaction.     Theresa Pierce  Stable overnight.  Hemoglobin 11.3 this a.m. plan for EGD with esophageal dilation as feasible/appropriate along with a diagnostic colonoscopy today.  The risks, benefits, limitations, imponderables and alternatives regarding both EGD and colonoscopy have been reviewed with the patient. Questions have been answered. All parties agreeable.

## 2024-10-27 NOTE — Transfer of Care (Signed)
 Immediate Anesthesia Transfer of Care Note  Patient: Theresa Pierce  Procedure(s) Performed: COLONOSCOPY EGD (ESOPHAGOGASTRODUODENOSCOPY) DILATION, ESOPHAGUS POLYPECTOMY, INTESTINE  Patient Location: PACU  Anesthesia Type:MAC  Level of Consciousness: awake and sedated  Airway & Oxygen Therapy: Patient Spontanous Breathing  Post-op Assessment: Report given to RN and Post -op Vital signs reviewed and stable  Post vital signs: Reviewed and stable  Last Vitals:  Vitals Value Taken Time  BP 120/65 10/27/24 10:45  Temp 36.9 C 10/27/24 10:39  Pulse 75 10/27/24 10:51  Resp 16 10/27/24 10:51  SpO2 100 % 10/27/24 10:51  Vitals shown include unfiled device data.  Last Pain:  Vitals:   10/27/24 1045  TempSrc:   PainSc: 0-No pain         Complications: No notable events documented.

## 2024-10-27 NOTE — Anesthesia Preprocedure Evaluation (Addendum)
"                                    Anesthesia Evaluation  Patient identified by MRN, date of birth, ID band Patient awake    Reviewed: Allergy & Precautions, H&P , NPO status , Patient's Chart, lab work & pertinent test results  Airway Mallampati: II  TM Distance: >3 FB Neck ROM: Full    Dental no notable dental hx.    Pulmonary neg pulmonary ROS   Pulmonary exam normal breath sounds clear to auscultation       Cardiovascular hypertension, + Peripheral Vascular Disease  Normal cardiovascular exam Rhythm:Regular Rate:Normal     Neuro/Psych negative neurological ROS  negative psych ROS   GI/Hepatic negative GI ROS, Neg liver ROS,,,  Endo/Other  negative endocrine ROS    Renal/GU negative Renal ROS  negative genitourinary   Musculoskeletal negative musculoskeletal ROS (+)    Abdominal   Peds negative pediatric ROS (+)  Hematology negative hematology ROS (+)   Anesthesia Other Findings   Reproductive/Obstetrics negative OB ROS                              Anesthesia Physical Anesthesia Plan  ASA: 3 and emergent  Anesthesia Plan: MAC   Post-op Pain Management:    Induction: Intravenous  PONV Risk Score and Plan:   Airway Management Planned: Nasal Cannula and Natural Airway  Additional Equipment:   Intra-op Plan:   Post-operative Plan:   Informed Consent: I have reviewed the patients History and Physical, chart, labs and discussed the procedure including the risks, benefits and alternatives for the proposed anesthesia with the patient or authorized representative who has indicated his/her understanding and acceptance.     Dental advisory given  Plan Discussed with: Surgeon  Anesthesia Plan Comments:          Anesthesia Quick Evaluation  "

## 2024-10-27 NOTE — Op Note (Signed)
 [facility] Patient Name: Theresa Pierce Procedure Date: 10/27/2024 9:41 AM MRN: [id number] Date of Birth: March 29, 1946 Attending MD: Lamar Ozell Hollingshead , MD, [id number] CSN: [id number] Age: 79 Admit Type: Inpatient Procedure:                Upper GI endoscopy Indications:              Dysphagia, Heartburn, Melena Providers:                Lamar Ozell Hollingshead, MD, Olam Ada, RN, Gordy Lonni Balm, Technician Referring MD:              Medicines:                Propofol  per Anesthesia Complications:            No immediate complications. Estimated Blood Loss:     Estimated blood loss was minimal. Procedure:                Pre-Anesthesia Assessment:                           - Prior to the procedure, a History and Physical                            was performed, and patient medications and                            allergies were reviewed. The patient's tolerance of                            previous anesthesia was also reviewed. The risks                            and benefits of the procedure and the sedation                            options and risks were discussed with the patient.                            All questions were answered, and informed consent                            was obtained. Prior Anticoagulants: The patient has                            taken no anticoagulant or antiplatelet agents. ASA                            Grade Assessment: III - A patient with severe                            systemic disease. After reviewing the risks and  benefits, the patient was deemed in satisfactory                            condition to undergo the procedure.                           After obtaining informed consent, the endoscope was                            passed under direct vision. Throughout the                            procedure, the patient's blood pressure, pulse, and                             oxygen saturations were monitored continuously. The                            [id number] ([id number]) Upper was introduced through                            the mouth, and advanced to the second part of                            duodenum. The upper GI endoscopy was accomplished                            without difficulty. The patient tolerated the                            procedure well. Scope In: 10:04:07 AM Scope Out: 10:10:26 AM Total Procedure Duration: 0 hours 6 minutes 19 seconds  Findings:      A moderate Schatzki ring was found at the gastroesophageal junction. No       tumor, Barrett's esophagus or esophagitis.      A small hiatal hernia was present. Gastric mucosa appeared normal       pylorus patent.      The duodenal bulb and second portion of the duodenum were normal. The       scope was withdrawn. Dilation was performed with a Maloney dilator with       mild resistance at 54 Fr. The dilation site was examined following       endoscope reinsertion and showed no change. Estimated blood loss: none.       Hypopharynx small did not attempt to pass the larger bore dilator.       Subsequently, four-quadrant bites of the ring were taken with cold       biopsy forceps to disrupt it. This was done effectively without apparent       complication. Impression:               - Moderate Schatzki ring. Dilated.                           - Small hiatal hernia.                           -  Normal duodenal bulb and second portion of the                            duodenum.                           - No specimens collected. Moderate Sedation:      Moderate (conscious) sedation was personally administered by an       anesthesia professional. The following parameters were monitored: oxygen       saturation, heart rate, blood pressure, respiratory rate, EKG, adequacy       of pulmonary ventilation, and response to care. Recommendation:           - Return patient to hospital ward for  observation.                           - Advance diet as tolerated.                           - Continue present medications. Daily PPI. See                            colonoscopy report. Procedure Code(s):        --- Professional ---                           [phone], Esophagogastroduodenoscopy, flexible,                            transoral; diagnostic, including collection of                            specimen(s) by brushing or washing, when performed                            (separate procedure)                           43450, Dilation of esophagus, by unguided sound or                            bougie, single or multiple passes Diagnosis Code(s):        --- Professional ---                           K22.2, Esophageal obstruction                           K44.9, Diaphragmatic hernia without obstruction or                            gangrene                           R13.10, Dysphagia, unspecified                           R12, Heartburn  K92.1, Melena (includes Hematochezia) CPT copyright 2022 [employer]. All rights reserved. The codes documented in this report are preliminary and upon coder review may  be revised to meet current compliance requirements. Lamar HERO. Klyn Kroening, MD Lamar Ozell Hollingshead, MD 10/27/2024 10:36:46 AM This report has been signed electronically. Number of Addenda: 0

## 2024-10-27 NOTE — Plan of Care (Signed)
   Problem: Education: Goal: Knowledge of General Education information will improve Description: Including pain rating scale, medication(s)/side effects and non-pharmacologic comfort measures Outcome: Progressing   Problem: Activity: Goal: Risk for activity intolerance will decrease Outcome: Progressing   Problem: Coping: Goal: Level of anxiety will decrease Outcome: Progressing

## 2024-10-27 NOTE — Discharge Summary (Signed)
 Physician Discharge Summary  KATLYNNE MCKERCHER FMW:[id number] DOB: 1945-11-17 DOA: 10/25/2024  PCP: Marvine Rush, MD  Admit date: 10/25/2024 Discharge date: 10/27/2024  Admitted From:  Home  Disposition: Home   Recommendations for Outpatient Follow-up:  Follow up with PCP in 1 weeks Follow up with [facility] for biopsy results  Home Health: N/A  Discharge Condition: STABLE   CODE STATUS: FULL DIET: soft foods, advance as tolerated    Brief Hospitalization Summary: Please see all hospital notes, images, labs for full details of the hospitalization. Admission provider HPI:  79 y.o. female, with past medical history of GERD, TGA, hypertension, patient presents to ED secondary to complaints of passing blood clots this morning, patient reports she ate coleslaw yesterday, she felt nausea, vomiting after that with abdominal cramps, and early a.m. she started to pass blood clots, she denies diarrhea, fever, chills, reports she had had them x 4 so far, most recent at 2 PM prior to come to ED, she denies any history of GI bleed in the past, but as well she reports she never had colonoscopy or endoscopy or evaluated by GI for any reason. - In ED CT abdomen pelvis with no acute findings but significant for atherosclerosis disease, hemoglobin 12.4, most recent was 15.1 on 01/11/2023, ED discussed with GI who recommended admission on IV Protonix , clear liquid diet, and [facility] consulted to admit  Hospital Course by listed problems addressed   Hematochezia -- Resolved  Diverticulosis Presumed diverticular bleed  - Patient presented with multiple bowel movements with significant blood clots started overnight - Hemoglobin down to 11.6  - Patient with no GI workup in the past, no history of endoscopy or colonoscopy. -CT abdomen pelvis with no acute findings - GI evaluation 1/3 and patient had EGD and colonoscopy on 10/27/24 Colonoscopy per Dr. Shaaron:  - Diverticulosis in the entire examined colon.                             - The examination was otherwise normal on direct and retroflexion views.                           - One 5 mm polyp at the ileocecal valve, removed with a cold snare. Resected and retrieved. Recommendation:   - Repeat colonoscopy date to be determined after pending pathology results are reviewed for                            surveillance.                           - Return patient to hospital ward for possible discharge same day.                           - Advance diet as tolerated.                           - Continue present medications. See EGD report.                            Etiology of hematochezia not entirely clear - diverticular etiology most likely. Did not see  anything consistent with ischemia or colitis.  Appears to be a self-limiting phenomenon.                           - From a GI standpoint, she can probably be discharged later today. See EGD report. If no                            further bleeding then no further GI evaluation warranted. I discussed my findings and                            recommendations face-to-face with patient's son Anabelen Kaminsky in the waiting room.  EGD: 10/27/24 per Dr. Shaaron:  Impression:   - Moderate Schatzki ring. Dilated.                           - Small hiatal hernia.                           - Normal duodenal bulb and second portion of the duodenum.                           - No specimens collected. Recommendation:  - Return patient to hospital ward for observation.                           - Advance diet as tolerated.                           - Continue present medications. Daily PPI.    Hypertension - resume home meds and follow up with PCP   Discharge Diagnoses:  Principal Problem:   Hematochezia Active Problems:   Amnesia   Essential hypertension   Discharge Instructions:  Allergies as of 10/27/2024       Reactions   Amoxicillin Other (See Comments)   Did it  involve swelling of the face/tongue/throat, SOB, or low BP? No Did it involve sudden or severe rash/hives, skin peeling, or any reaction on the inside of your mouth or nose? No Did you need to seek medical attention at a hospital or doctor's office? Yes When did it last happen?   Her legs burned    If all above answers are NO, may proceed with cephalosporin use.   Codeine Itching        Medication List     STOP taking these medications    doxycycline  100 MG tablet Commonly known as: ADOXA       TAKE these medications    acetaminophen  325 MG tablet Commonly known as: TYLENOL  Take 2 tablets (650 mg total) by mouth every 4 (four) hours as needed for mild pain (or temp > 37.5 C (99.5 F)). What changed: when to take this   amLODipine  10 MG tablet Commonly known as: NORVASC  Take 10 mg by mouth daily.   aspirin  EC 81 MG tablet Take 81 mg by mouth See admin instructions. Takes one tablet every 4 to 5 days   cholecalciferol 25 MCG (1000 UNIT) tablet Commonly known as: VITAMIN D3 Take 1,000 Units by mouth daily.   meloxicam 7.5 MG tablet Commonly known as: MOBIC Take 7.5 mg by  mouth daily.   omeprazole 20 MG tablet Commonly known as: PRILOSEC OTC Take 20 mg by mouth daily.        Follow-up Information     Marvine Rush, MD. Schedule an appointment as soon as possible for a visit in 1 week(s).   Specialty: Family Medicine Why: Hospital Follow Up Contact information: [address] [place] [address] [phone]         [facility] Follow up.   Why: As needed Contact information: [address] [place]  [address] [phone]               Allergies[1] Allergies as of 10/27/2024       Reactions   Amoxicillin Other (See Comments)   Did it involve swelling of the face/tongue/throat, SOB, or low BP? No Did it involve sudden or severe rash/hives, skin peeling, or any reaction on the inside of your mouth  or nose? No Did you need to seek medical attention at a hospital or doctor's office? Yes When did it last happen?   Her legs burned    If all above answers are NO, may proceed with cephalosporin use.   Codeine Itching        Medication List     STOP taking these medications    doxycycline  100 MG tablet Commonly known as: ADOXA       TAKE these medications    acetaminophen  325 MG tablet Commonly known as: TYLENOL  Take 2 tablets (650 mg total) by mouth every 4 (four) hours as needed for mild pain (or temp > 37.5 C (99.5 F)). What changed: when to take this   amLODipine  10 MG tablet Commonly known as: NORVASC  Take 10 mg by mouth daily.   aspirin  EC 81 MG tablet Take 81 mg by mouth See admin instructions. Takes one tablet every 4 to 5 days   cholecalciferol 25 MCG (1000 UNIT) tablet Commonly known as: VITAMIN D3 Take 1,000 Units by mouth daily.   meloxicam 7.5 MG tablet Commonly known as: MOBIC Take 7.5 mg by mouth daily.   omeprazole 20 MG tablet Commonly known as: PRILOSEC OTC Take 20 mg by mouth daily.        Procedures/Studies: CT ABDOMEN PELVIS W CONTRAST Result Date: 10/25/2024 EXAM: CT ABDOMEN AND PELVIS WITH CONTRAST 10/25/2024 05:07:46 PM TECHNIQUE: CT of the abdomen and pelvis was performed with the administration of 100 mL of iohexol  (OMNIPAQUE ) 300 MG/ML solution. Multiplanar reformatted images are provided for review. Automated exposure control, iterative reconstruction, and/or weight-based adjustment of the mA/kV was utilized to reduce the radiation dose to as low as reasonably achievable. COMPARISON: None available. CLINICAL HISTORY: Abdominal pain, acute, nonlocalized. FINDINGS: LOWER CHEST: No acute abnormality. LIVER: The liver is unremarkable. GALLBLADDER AND BILE DUCTS: Tiny hyperdensity within the gallbladder lumen may represent a tiny gallstone. No gallbladder wall thickening or pericholecystic fluid. No biliary ductal dilatation. SPLEEN: No  acute abnormality. PANCREAS: No acute abnormality. ADRENAL GLANDS: No acute abnormality. KIDNEYS, URETERS AND BLADDER: No stones in the kidneys or ureters. No hydronephrosis. No perinephric or periureteral stranding. No filling defects of the partially visualized collecting systems on delayed imaging. Urinary bladder is unremarkable. GI AND BOWEL: Stomach demonstrates no acute abnormality. No small or large bowel thickening or dilatation. The appendix is unremarkable. Colonic diverticulosis. There is no bowel obstruction. PERITONEUM AND RETROPERITONEUM: No ascites. No free air. VASCULATURE: Aorta is normal in caliber. Severe atherosclerotic plaque. LYMPH NODES: No lymphadenopathy. REPRODUCTIVE ORGANS: Atrophic louterus.  No adnexal mass. BONES AND SOFT TISSUES: Total right hip arthroplasty. Grade 1 anterolisthesis of L4 on L5. No acute osseous abnormality. No focal soft tissue abnormality. IMPRESSION: 1. No acute findings in the abdomen or pelvis. 2. Cholelithiasis without CT evidence of acute cholecystitis. 3. Severe atherosclerotic plaque. Electronically signed by: Morgane Naveau MD 10/25/2024 06:11 PM EST RP Workstation: [id number]     Subjective: Pt reports no more rectal bleeding.  Tolerating diet well. No complaints.   Discharge Exam: Vitals:   10/27/24 1100 10/27/24 1116  BP: 135/73 136/76  Pulse: 79 79  Resp: 14 16  Temp:  97.9 F (36.6 C)  SpO2: 100% 97%   Vitals:   10/27/24 1039 10/27/24 1045 10/27/24 1100 10/27/24 1116  BP: 110/69 120/65 135/73 136/76  Pulse: 80 81 79 79  Resp: 14 15 14 16   Temp: 98.5 F (36.9 C)   97.9 F (36.6 C)  TempSrc:    Oral  SpO2: 99% 96% 100% 97%  Weight:      Height:       General: Pt is alert, awake, not in acute distress Cardiovascular: normal S1/S2 +, no rubs, no gallops Respiratory: CTA bilaterally, no wheezing, no rhonchi Abdominal: Soft, NT, ND, bowel sounds + Extremities: no edema, no cyanosis   The results of significant diagnostics  from this hospitalization (including imaging, microbiology, ancillary and laboratory) are listed below for reference.     Microbiology: No results found for this or any previous visit (from the past 240 hours).   Labs: BNP (last 3 results) No results for input(s): BNP in the last 8760 hours. Basic Metabolic Panel: Recent Labs  Lab 10/25/24 1503 10/26/24 0409  NA 139 139  K 4.0 3.6  CL 102 101  CO2 24 30  GLUCOSE 90 84  BUN 22 15  CREATININE 0.82 0.62  CALCIUM  9.6 9.1   Liver Function Tests: Recent Labs  Lab 10/25/24 1503  AST 16  ALT 9  ALKPHOS 91  BILITOT 0.4  PROT 6.6  ALBUMIN 3.8   Recent Labs  Lab 10/25/24 1503  LIPASE 20   No results for input(s): AMMONIA in the last 168 hours. CBC: Recent Labs  Lab 10/25/24 1503 10/26/24 0409 10/27/24 0429  WBC 7.3 5.5 5.8  NEUTROABS 5.2  --   --   HGB 12.4 11.6* 11.3*  HCT 38.6 35.9* 34.1*  MCV 96.7 96.2 95.5  PLT 225 205 220   Cardiac Enzymes: No results for input(s): CKTOTAL, CKMB, CKMBINDEX, TROPONINI in the last 168 hours. BNP: Invalid input(s): POCBNP CBG: No results for input(s): GLUCAP in the last 168 hours. D-Dimer No results for input(s): DDIMER in the last 72 hours. Hgb A1c No results for input(s): HGBA1C in the last 72 hours. Lipid Profile No results for input(s): CHOL, HDL, LDLCALC, TRIG, CHOLHDL, LDLDIRECT in the last 72 hours. Thyroid  function studies No results for input(s): TSH, T4TOTAL, T3FREE, THYROIDAB in the last 72 hours.  Invalid input(s): FREET3 Anemia work up No results for input(s): VITAMINB12, FOLATE, FERRITIN, TIBC, IRON, RETICCTPCT in the last 72 hours. Urinalysis    Component Value Date/Time   COLORURINE STRAW (A) 09/16/2022 0812   APPEARANCEUR CLEAR 09/16/2022 0812   LABSPEC 1.005 09/16/2022 0812   PHURINE 8.0 09/16/2022 0812   GLUCOSEU NEGATIVE 09/16/2022 0812   HGBUR NEGATIVE 09/16/2022 0812   BILIRUBINUR  NEGATIVE 09/16/2022 0812   KETONESUR 5 (A) 09/16/2022 0812   PROTEINUR NEGATIVE 09/16/2022 0812   NITRITE NEGATIVE 09/16/2022 0812   LEUKOCYTESUR NEGATIVE  09/16/2022 0812   Sepsis Labs Recent Labs  Lab 10/25/24 1503 10/26/24 0409 10/27/24 0429  WBC 7.3 5.5 5.8   Microbiology No results found for this or any previous visit (from the past 240 hours).  Time coordinating discharge:  34 mins  SIGNED:  Afton Louder, MD  [facility] 10/27/2024, 12:30 PM How to contact the [facility] Attending or Consulting provider 7A - 7P or covering provider during after hours 7P -7A, for this patient?  Check the care team in [facility] and look for a) attending/consulting [facility] provider listed and b) the [facility] team listed Log into [email or web address] and use [place]'s universal password to access. If you do not have the password, please contact the hospital operator. Locate the [facility] provider you are looking for under [facility] and page to a number that you can be directly reached. If you still have difficulty reaching the provider, please page the [facility] (Director on Call) for the Hospitalists listed on amion for assistance.     [1]  Allergies Allergen Reactions   Amoxicillin Other (See Comments)    Did it involve swelling of the face/tongue/throat, SOB, or low BP? No Did it involve sudden or severe rash/hives, skin peeling, or any reaction on the inside of your mouth or nose? No Did you need to seek medical attention at a hospital or doctor's office? Yes When did it last happen?   Her legs burned    If all above answers are NO, may proceed with cephalosporin use.    Codeine Itching

## 2024-10-28 ENCOUNTER — Encounter (HOSPITAL_COMMUNITY): Payer: Self-pay | Admitting: Internal Medicine

## 2024-10-28 ENCOUNTER — Other Ambulatory Visit (HOSPITAL_COMMUNITY): Payer: Self-pay | Admitting: Family Medicine

## 2024-10-28 DIAGNOSIS — Z1231 Encounter for screening mammogram for malignant neoplasm of breast: Secondary | ICD-10-CM

## 2024-10-28 LAB — OCCULT BLOOD, POC DEVICE: Fecal Occult Bld: POSITIVE — AB

## 2024-10-29 ENCOUNTER — Ambulatory Visit: Payer: Self-pay | Admitting: Internal Medicine

## 2024-10-29 LAB — SURGICAL PATHOLOGY

## 2024-11-01 ENCOUNTER — Encounter (HOSPITAL_COMMUNITY): Payer: Self-pay

## 2024-11-01 ENCOUNTER — Ambulatory Visit (HOSPITAL_COMMUNITY)
Admission: RE | Admit: 2024-11-01 | Discharge: 2024-11-01 | Disposition: A | Source: Ambulatory Visit | Attending: Family Medicine | Admitting: Family Medicine

## 2024-11-01 DIAGNOSIS — Z1231 Encounter for screening mammogram for malignant neoplasm of breast: Secondary | ICD-10-CM | POA: Insufficient documentation

## 2024-11-04 ENCOUNTER — Encounter (HOSPITAL_COMMUNITY): Payer: Self-pay | Admitting: Internal Medicine

## 2024-11-11 NOTE — Anesthesia Postprocedure Evaluation (Signed)
"   Anesthesia Post Note  Patient: Theresa Pierce  Procedure(s) Performed: COLONOSCOPY EGD (ESOPHAGOGASTRODUODENOSCOPY) DILATION, ESOPHAGUS POLYPECTOMY, INTESTINE  Patient location during evaluation: PACU Anesthesia Type: MAC Level of consciousness: awake and alert Pain management: pain level controlled Vital Signs Assessment: post-procedure vital signs reviewed and stable Respiratory status: spontaneous breathing, nonlabored ventilation, respiratory function stable and patient connected to nasal cannula oxygen Cardiovascular status: stable and blood pressure returned to baseline Postop Assessment: no apparent nausea or vomiting Anesthetic complications: no   No notable events documented.   Last Vitals:  Vitals:   10/27/24 1100 10/27/24 1116  BP: 135/73 136/76  Pulse: 79 79  Resp: 14 16  Temp:  36.6 C  SpO2: 100% 97%    Last Pain:  Vitals:   10/27/24 1116  TempSrc: Oral  PainSc: 0-No pain                 Andrea Limes      "
